# Patient Record
Sex: Female | Born: 1945 | Race: White | Hispanic: No | Marital: Single
Health system: Southern US, Community
[De-identification: ages and names within clinical notes are randomized; demographics above are authoritative.]

---

## 2004-05-29 ENCOUNTER — Emergency Department: Payer: Self-pay | Admitting: Unknown Physician Specialty

## 2004-06-23 ENCOUNTER — Emergency Department: Payer: Self-pay | Admitting: Emergency Medicine

## 2004-11-15 ENCOUNTER — Emergency Department: Payer: Self-pay | Admitting: Emergency Medicine

## 2005-02-24 ENCOUNTER — Emergency Department: Payer: Self-pay | Admitting: Emergency Medicine

## 2005-07-01 ENCOUNTER — Emergency Department: Payer: Self-pay | Admitting: Emergency Medicine

## 2005-08-17 ENCOUNTER — Other Ambulatory Visit: Payer: Self-pay

## 2005-08-17 ENCOUNTER — Emergency Department: Payer: Self-pay | Admitting: Emergency Medicine

## 2005-10-28 ENCOUNTER — Ambulatory Visit: Payer: Self-pay | Admitting: Gastroenterology

## 2006-01-01 ENCOUNTER — Other Ambulatory Visit: Payer: Self-pay

## 2006-01-01 ENCOUNTER — Emergency Department: Payer: Self-pay | Admitting: Emergency Medicine

## 2006-06-16 ENCOUNTER — Other Ambulatory Visit: Payer: Self-pay

## 2006-06-16 ENCOUNTER — Ambulatory Visit: Payer: Self-pay | Admitting: General Practice

## 2006-07-29 ENCOUNTER — Ambulatory Visit: Payer: Self-pay | Admitting: Family Medicine

## 2006-08-30 ENCOUNTER — Ambulatory Visit: Payer: Self-pay | Admitting: General Practice

## 2007-08-10 ENCOUNTER — Ambulatory Visit: Payer: Self-pay | Admitting: Family Medicine

## 2007-12-13 ENCOUNTER — Ambulatory Visit: Payer: Self-pay | Admitting: Gastroenterology

## 2008-01-26 ENCOUNTER — Ambulatory Visit: Payer: Self-pay | Admitting: Gastroenterology

## 2008-02-25 ENCOUNTER — Ambulatory Visit: Payer: Self-pay | Admitting: Oncology

## 2008-02-26 ENCOUNTER — Ambulatory Visit: Payer: Self-pay | Admitting: Oncology

## 2008-02-29 ENCOUNTER — Ambulatory Visit: Payer: Self-pay | Admitting: Oncology

## 2008-03-09 ENCOUNTER — Ambulatory Visit: Payer: Self-pay | Admitting: Oncology

## 2008-03-26 ENCOUNTER — Ambulatory Visit: Payer: Self-pay | Admitting: Oncology

## 2008-04-19 ENCOUNTER — Ambulatory Visit: Payer: Self-pay | Admitting: Oncology

## 2008-04-26 ENCOUNTER — Ambulatory Visit: Payer: Self-pay | Admitting: Oncology

## 2008-04-27 ENCOUNTER — Ambulatory Visit: Payer: Self-pay | Admitting: Oncology

## 2008-05-28 ENCOUNTER — Ambulatory Visit: Payer: Self-pay | Admitting: Oncology

## 2008-06-25 ENCOUNTER — Ambulatory Visit: Payer: Self-pay | Admitting: Oncology

## 2008-07-15 ENCOUNTER — Ambulatory Visit: Payer: Self-pay | Admitting: Oncology

## 2008-07-21 ENCOUNTER — Ambulatory Visit: Payer: Self-pay | Admitting: Oncology

## 2008-07-25 ENCOUNTER — Ambulatory Visit: Payer: Self-pay | Admitting: Oncology

## 2008-09-24 ENCOUNTER — Ambulatory Visit: Payer: Self-pay | Admitting: Oncology

## 2008-10-17 ENCOUNTER — Ambulatory Visit: Payer: Self-pay | Admitting: Oncology

## 2008-10-24 ENCOUNTER — Ambulatory Visit: Payer: Self-pay | Admitting: Oncology

## 2008-12-18 ENCOUNTER — Ambulatory Visit: Payer: Self-pay | Admitting: Family Medicine

## 2009-01-24 ENCOUNTER — Ambulatory Visit: Payer: Self-pay | Admitting: Family Medicine

## 2009-01-26 ENCOUNTER — Emergency Department: Payer: Self-pay | Admitting: Emergency Medicine

## 2009-03-26 ENCOUNTER — Ambulatory Visit: Payer: Self-pay | Admitting: Oncology

## 2009-04-16 ENCOUNTER — Ambulatory Visit: Payer: Self-pay | Admitting: Oncology

## 2009-04-26 ENCOUNTER — Ambulatory Visit: Payer: Self-pay | Admitting: Oncology

## 2009-06-24 ENCOUNTER — Ambulatory Visit: Payer: Self-pay | Admitting: Oncology

## 2009-07-16 ENCOUNTER — Ambulatory Visit: Payer: Self-pay | Admitting: Oncology

## 2009-07-24 ENCOUNTER — Ambulatory Visit: Payer: Self-pay | Admitting: General Practice

## 2009-07-25 ENCOUNTER — Ambulatory Visit: Payer: Self-pay | Admitting: Oncology

## 2009-08-08 ENCOUNTER — Ambulatory Visit: Payer: Self-pay | Admitting: General Practice

## 2009-09-24 ENCOUNTER — Ambulatory Visit: Payer: Self-pay | Admitting: Oncology

## 2009-10-15 ENCOUNTER — Ambulatory Visit: Payer: Self-pay | Admitting: Oncology

## 2009-10-24 ENCOUNTER — Ambulatory Visit: Payer: Self-pay | Admitting: Oncology

## 2009-12-25 ENCOUNTER — Ambulatory Visit: Payer: Self-pay | Admitting: Internal Medicine

## 2009-12-25 ENCOUNTER — Ambulatory Visit: Payer: Self-pay | Admitting: Oncology

## 2010-01-14 ENCOUNTER — Ambulatory Visit: Payer: Self-pay | Admitting: Oncology

## 2010-01-24 ENCOUNTER — Ambulatory Visit: Payer: Self-pay | Admitting: Oncology

## 2010-01-28 ENCOUNTER — Inpatient Hospital Stay: Payer: Self-pay | Admitting: Orthopedic Surgery

## 2010-06-01 ENCOUNTER — Ambulatory Visit: Payer: Self-pay | Admitting: Family Medicine

## 2010-07-15 ENCOUNTER — Ambulatory Visit: Payer: Self-pay | Admitting: Oncology

## 2010-07-26 ENCOUNTER — Ambulatory Visit: Payer: Self-pay | Admitting: Oncology

## 2011-06-22 ENCOUNTER — Ambulatory Visit: Payer: Self-pay | Admitting: Family Medicine

## 2011-07-08 ENCOUNTER — Ambulatory Visit: Payer: Self-pay | Admitting: Gastroenterology

## 2011-07-09 LAB — PATHOLOGY REPORT

## 2011-09-10 ENCOUNTER — Ambulatory Visit: Payer: Self-pay | Admitting: Family Medicine

## 2011-09-27 IMAGING — CR DG CHEST 1V PORT
1 series · 1 of 1 positions shown · non-contrast
Comparison: none

REASON FOR EXAM: pneumonia follow up
COMMENTS:

[view not recorded]
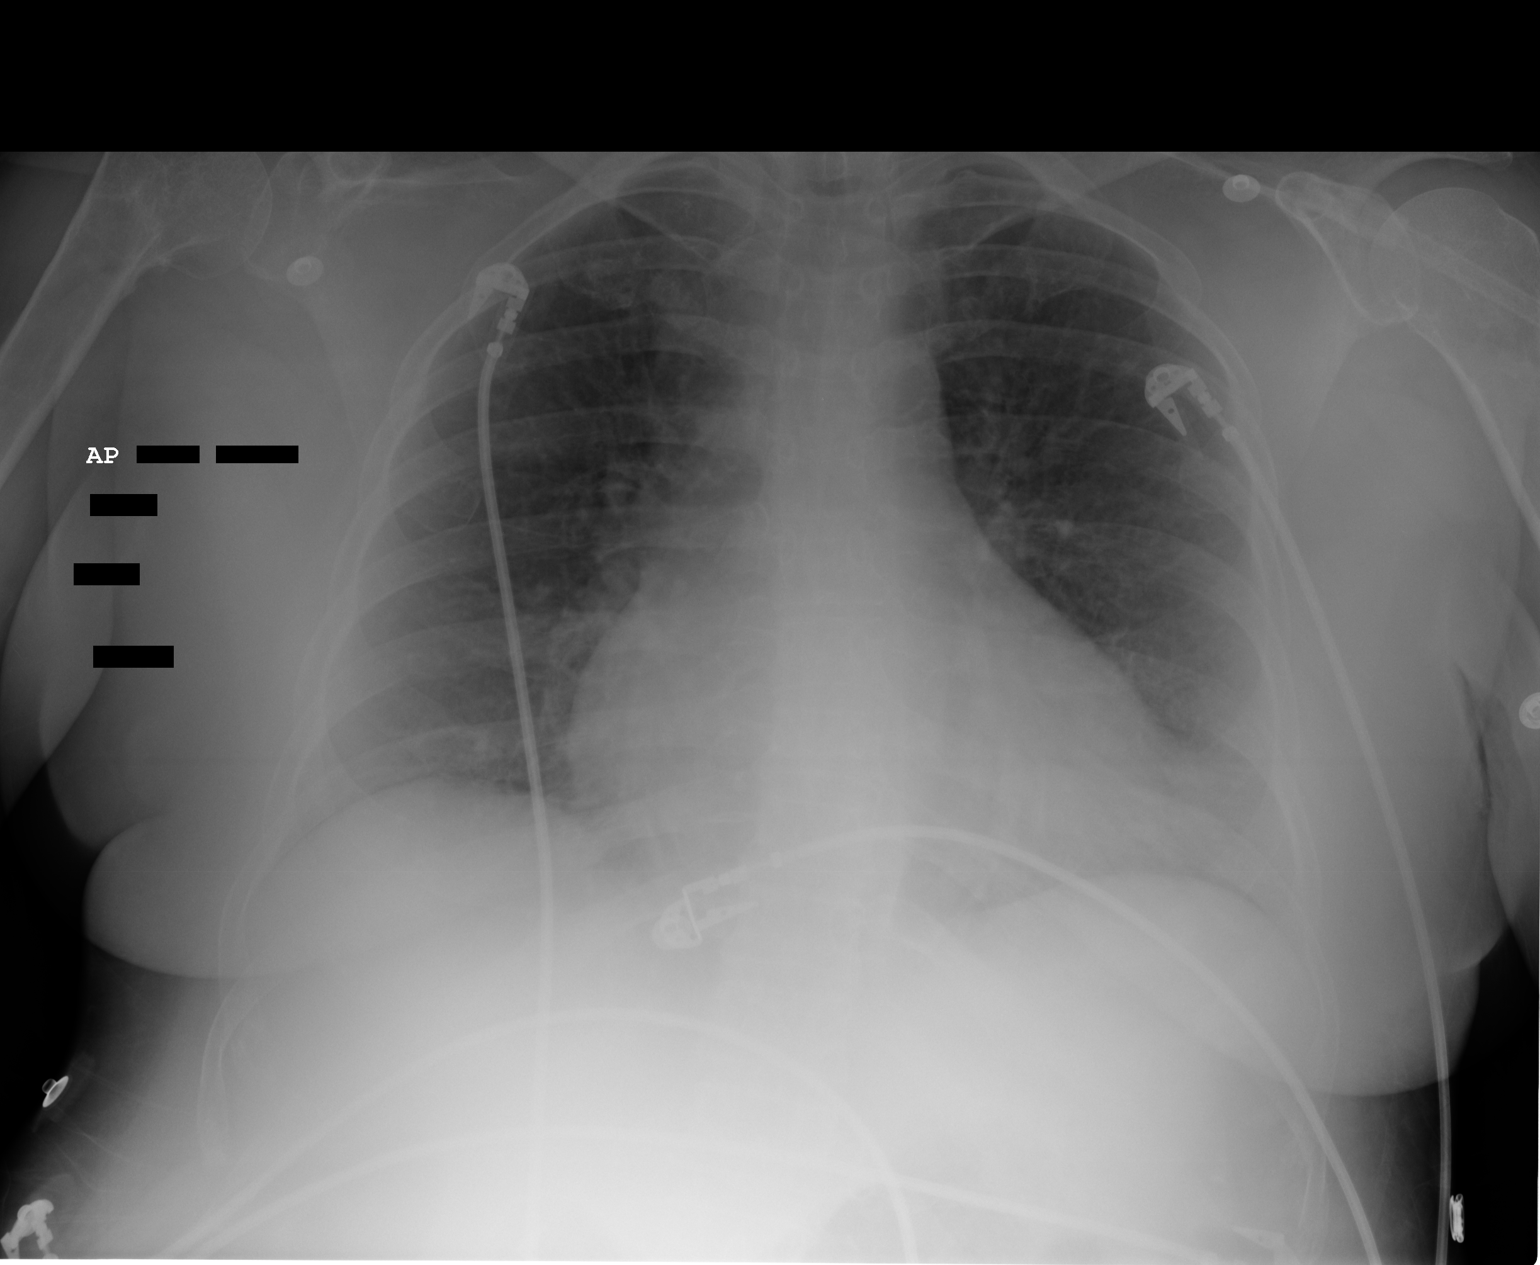

[1 of 1 positions shown; findings below may reference images not displayed]

PROCEDURE:     DXR - DXR PORTABLE CHEST SINGLE VIEW  - February 01, 2010  [DATE]

RESULT:     Comparison is made to the study 29 January, 2010.

The lungs are adequately inflated. There is no focal infiltrate. The cardiac
silhouette is mildly enlarged and the pulmonary vascularity is mildly
increased. I see no pleural effusion.
IMPRESSION: I do not see evidence of focal pneumonia. The prominence of
the cardiac silhouette and pulmonary interstitial markings suggest low-grade
CHF.

## 2011-09-27 IMAGING — CR DG ABDOMEN 1V
1 series · 1 of 1 positions shown · non-contrast
Comparison: none

REASON FOR EXAM: ileus follow up
COMMENTS:   Bedside (portable):Y

[view not recorded]
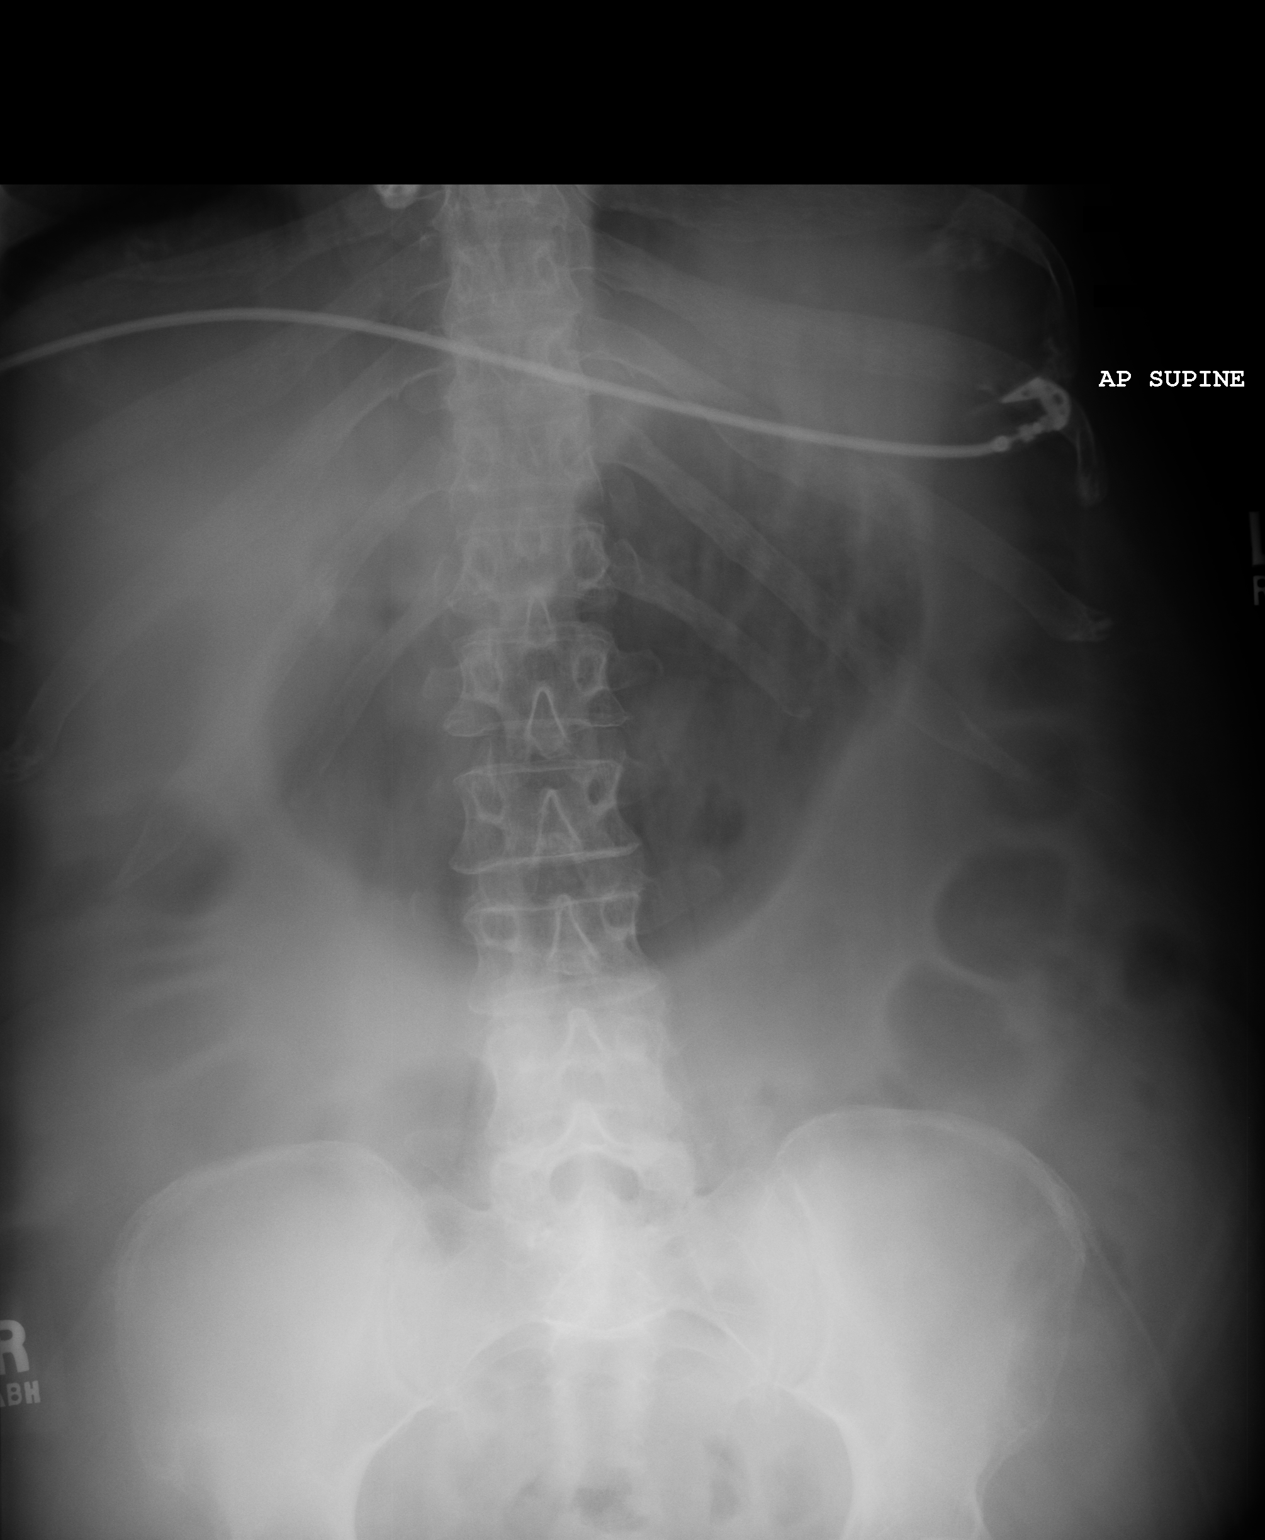

[1 of 1 positions shown; findings below may reference images not displayed]

PROCEDURE:     DXR - DXR KIDNEY URETER BLADDER  - February 01, 2010  [DATE]

RESULT:     Comparison is made to study 30 January, 2010.

The stomach is mildly distended with gas. Elsewhere I see no more than small
to moderate amount of gas within small and large bowel loops. The lower
pelvis is excluded on this study in the extreme lateral aspect of the right
flank is also excluded.
IMPRESSION: There is mild gaseous distention of the stomach which has
progressed since the previous study. Visualized portions of the abdomen and
pelvis elsewhere do not reveal definite evidence of obstruction.

## 2011-09-29 IMAGING — CR DG FEMUR 2V*R*
1 series · 4 of 4 positions shown · non-contrast
Comparison: none

REASON FOR EXAM: post op
COMMENTS:   LMP: Post-Menopausal

[Series 1: view not recorded · 0.17mm/px · 4 of 4 slices shown]
[im 1/4]
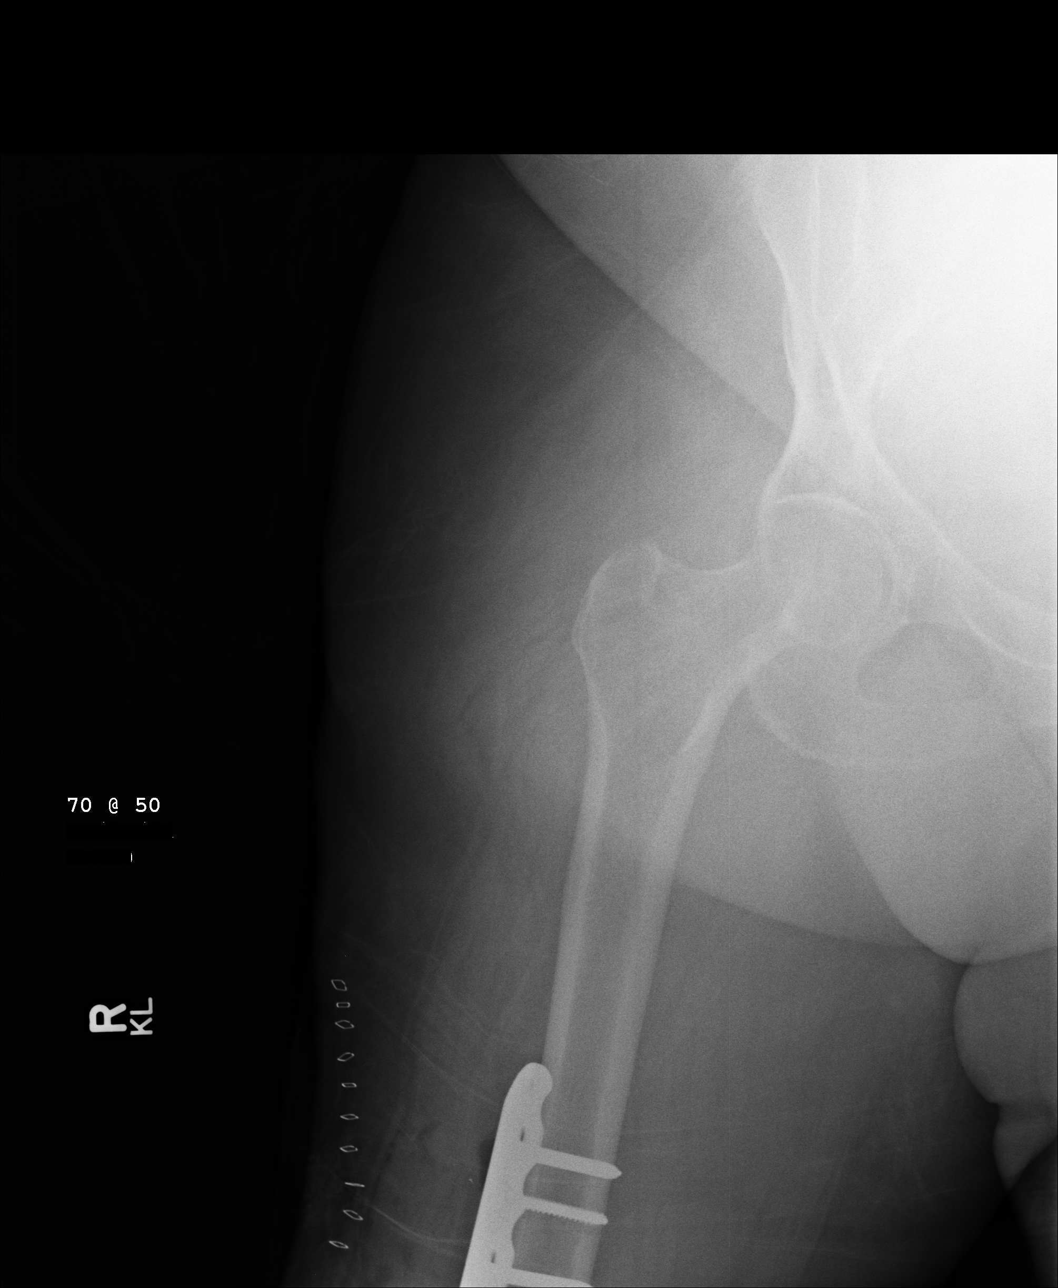
[im 2/4]
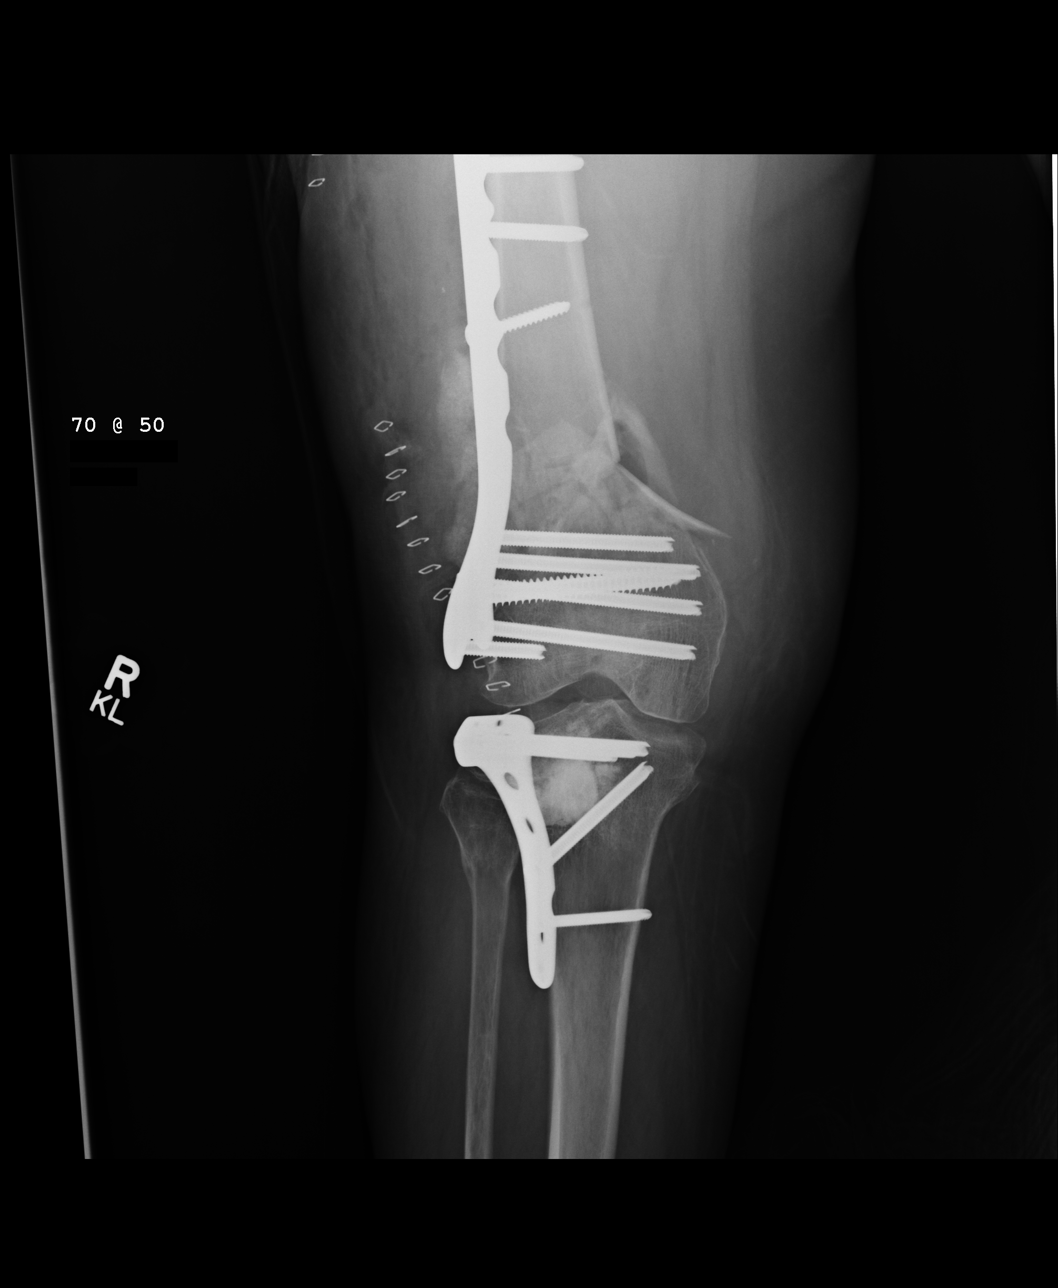
[im 3/4]
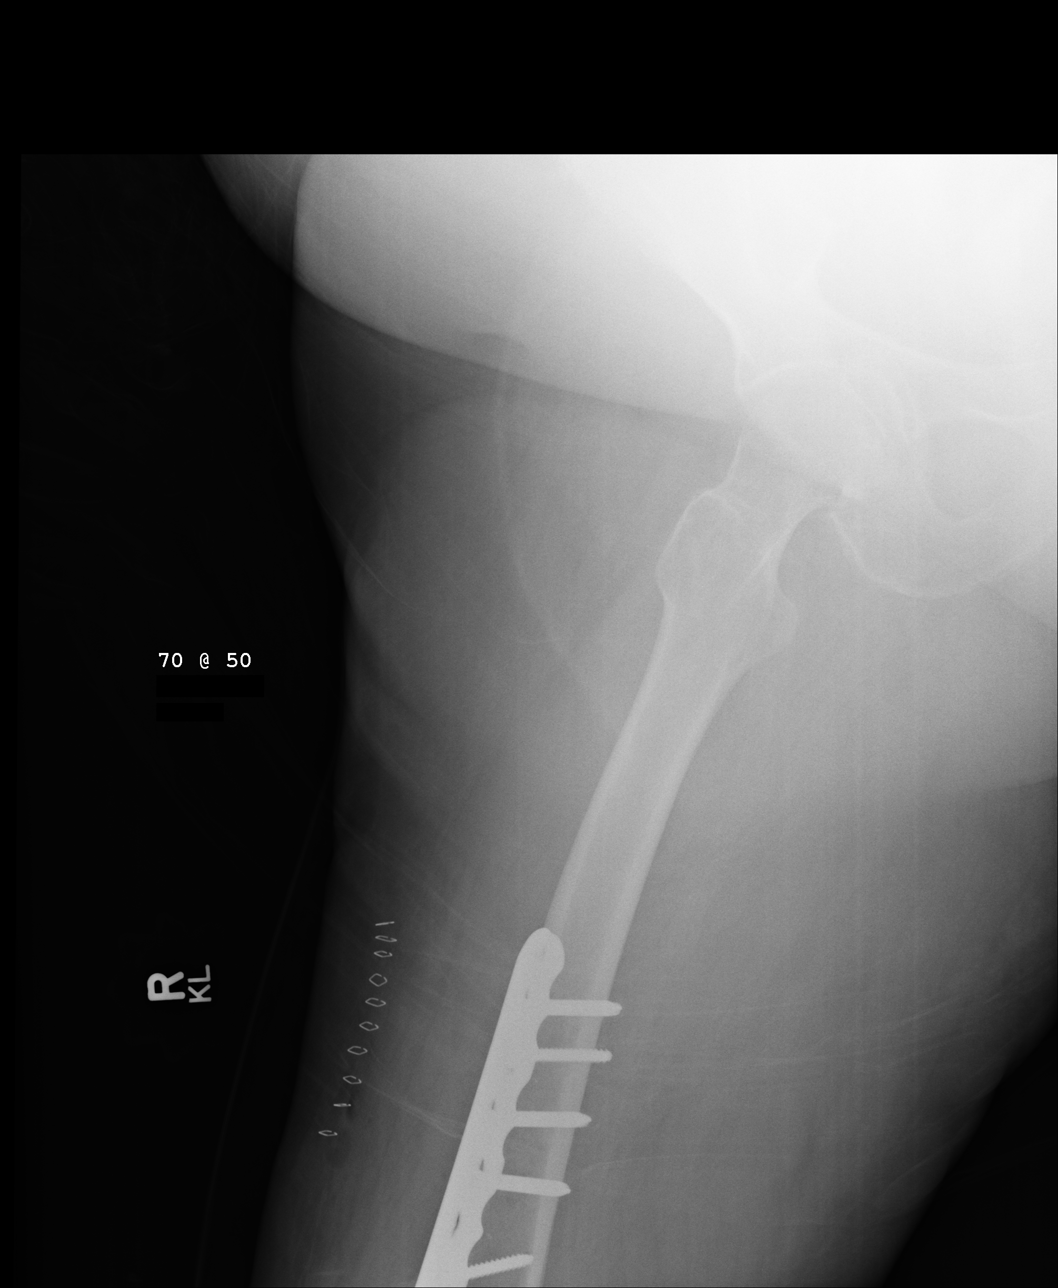
[im 4/4]
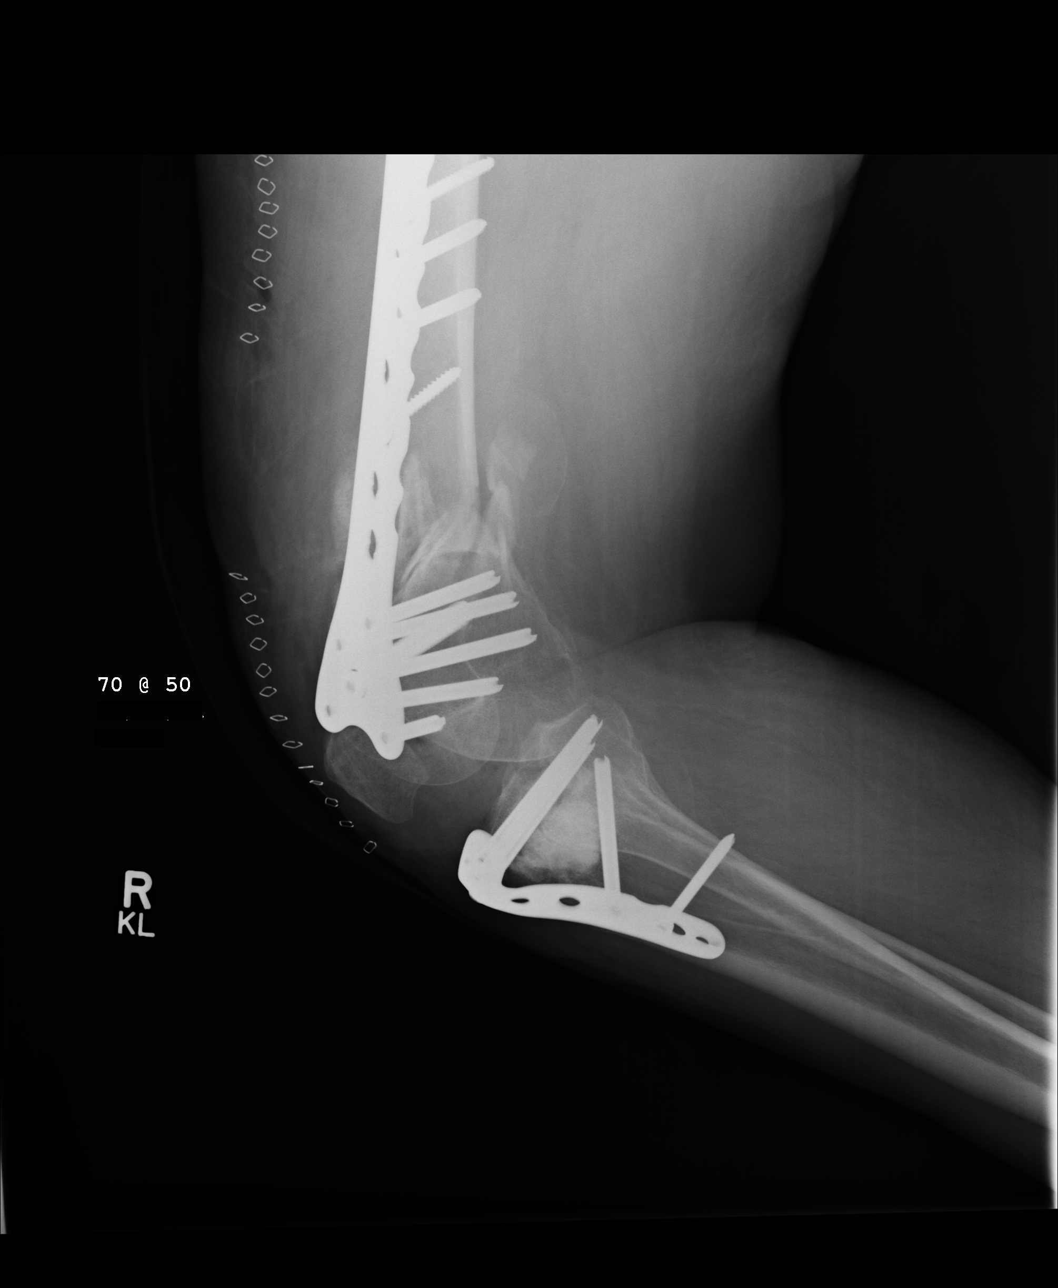

[4 of 4 positions shown; findings below may reference images not displayed]

PROCEDURE:     DXR - DXR FEMUR RIGHT  - February 03, 2010  [DATE]

RESULT:     No acute abnormality identified. The patient has a surgical
plate in the mid femur. Distal femoral fracture is reduced with slight
posterior displacement. Plate reduction of the proximal tibia is noted with
good anatomic alignment.
IMPRESSION: No acute abnormality. The patient has a surgical plate in
the mid femur. The distal femoral fracture has been reduced. There is mild
posterior displacement of the distal fracture fragment. Plate and screw also
noted in the proximal tibia. Good anatomic alignment of the tibia noted.

## 2011-09-29 IMAGING — CR DG ABDOMEN 2V
1 series · 2 of 2 positions shown · non-contrast
Comparison: none

REASON FOR EXAM: colonic ileus/pseudoobstruction
COMMENTS:

[Series 1: view not recorded · 0.17mm/px · 2 of 2 slices shown]
[im 1/2]
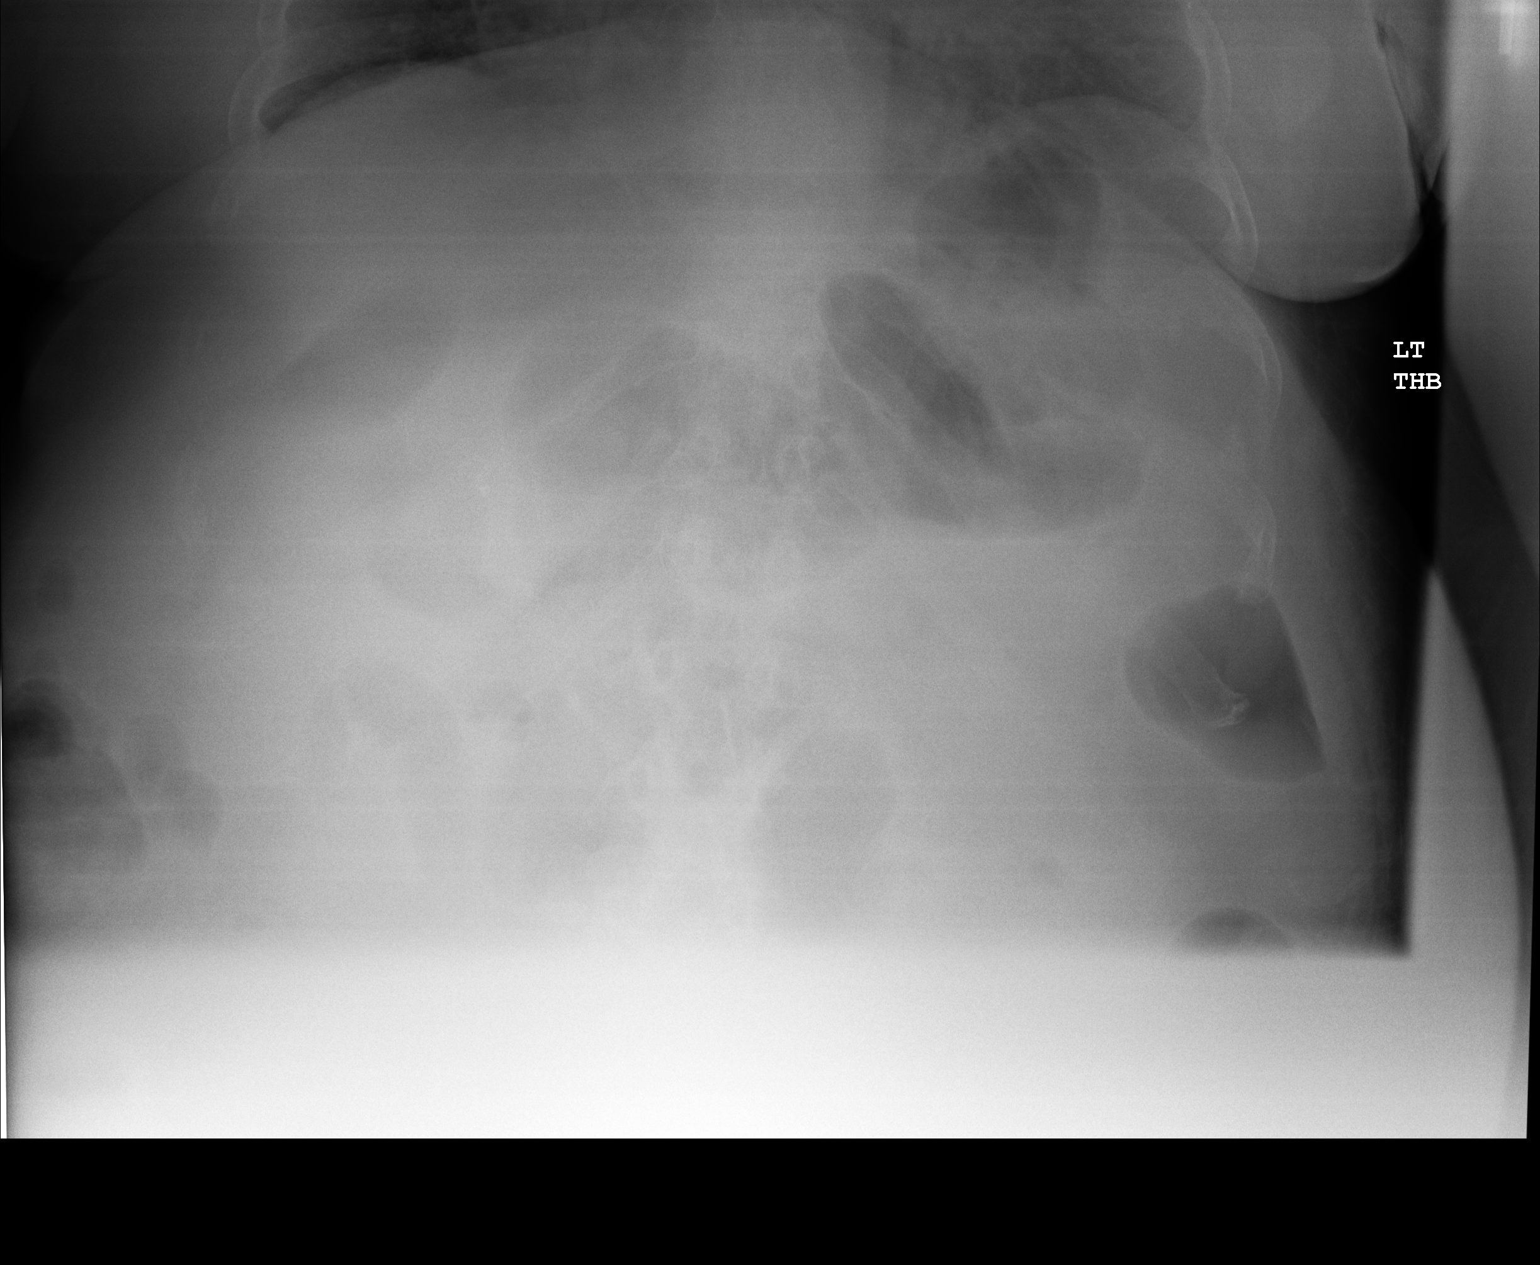
[im 2/2]
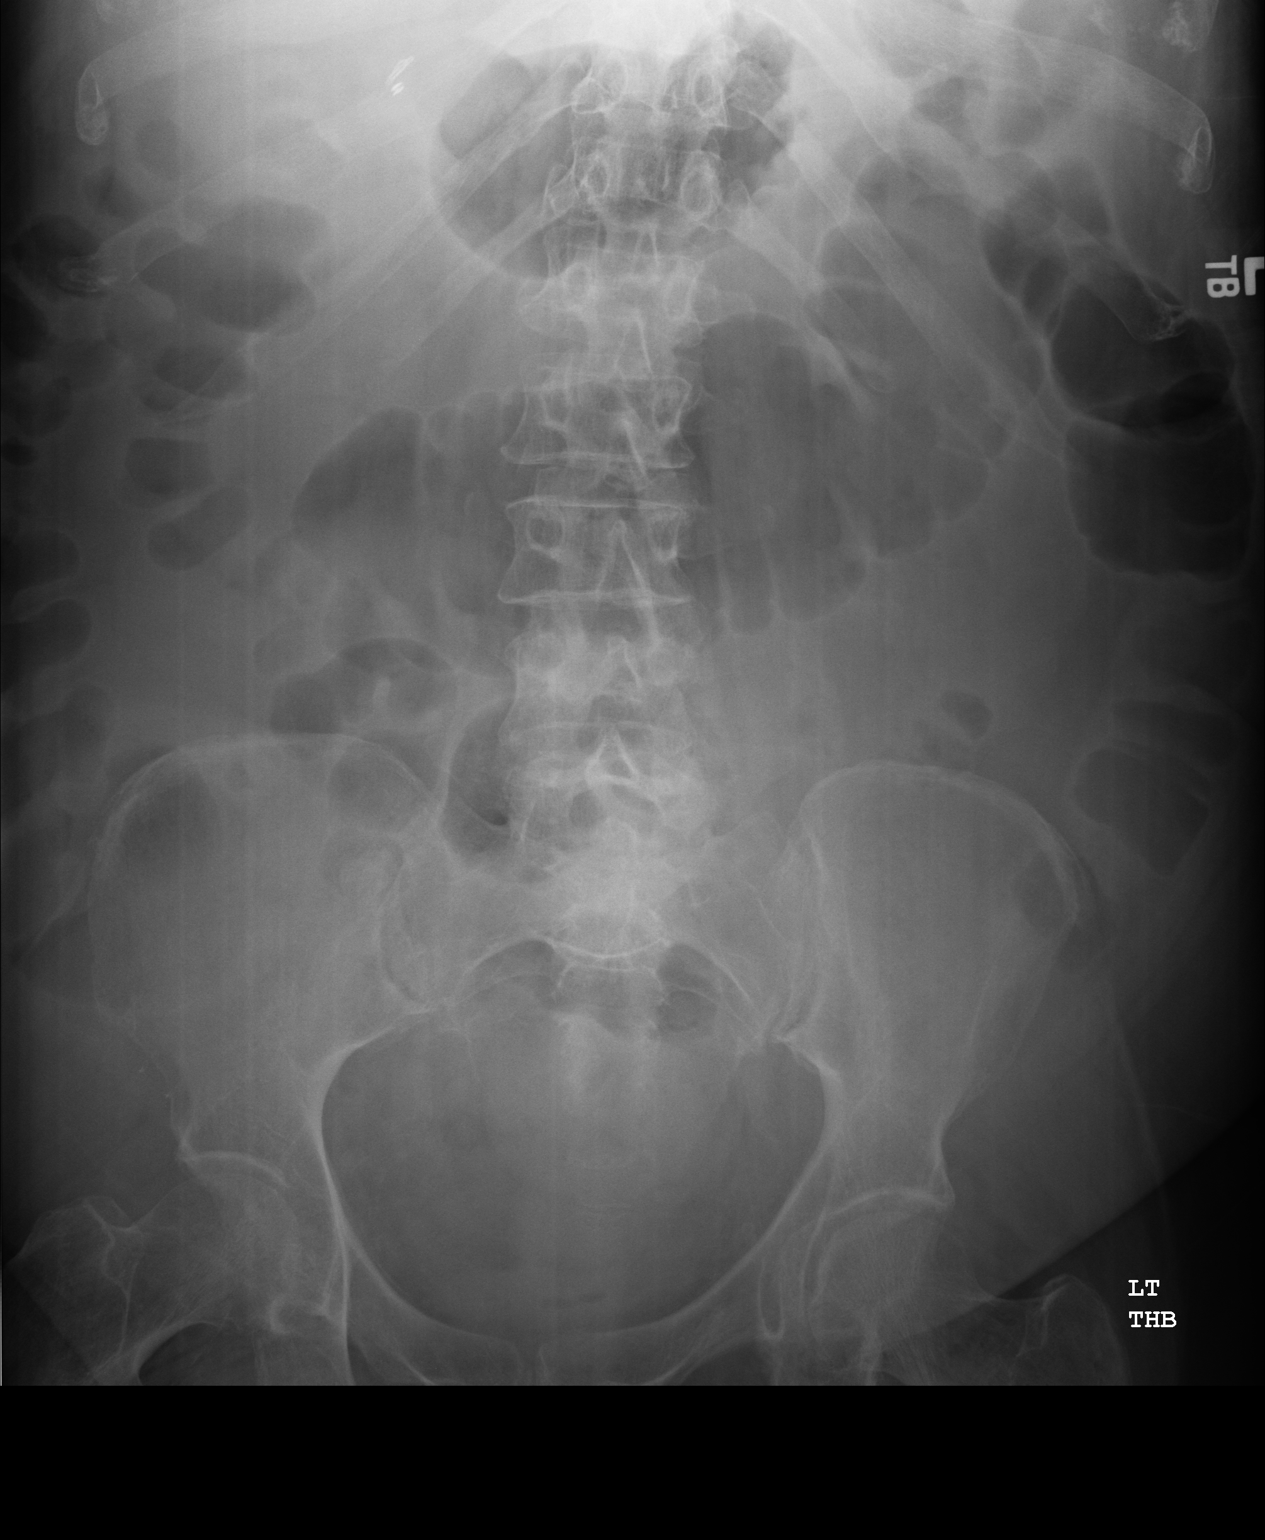

[2 of 2 positions shown; findings below may reference images not displayed]

PROCEDURE:     DXR - DXR ABDOMEN 2 V FLAT AND ERECT  - February 03, 2010  [DATE]

RESULT:     Supine and upright abdominal films are submitted. If the
patient's condition the upright film includes only the mid and upper abdomen.

The bowel gas pattern is relatively nonspecific. There is a moderate amount
of gas within the colon. There is some gas within the stomach. I see no
significant small bowel gas collections. No significant gas is seen in the
rectum.
IMPRESSION: The bowel gas pattern is nonspecific. The entities of
colonic ileus or pseudoobstruction arereasonable considerations but no more
than mild gaseous distention of the colon is seen.

## 2011-11-10 ENCOUNTER — Ambulatory Visit: Payer: Self-pay | Admitting: Family Medicine

## 2012-02-27 ENCOUNTER — Inpatient Hospital Stay: Payer: Self-pay | Admitting: Internal Medicine

## 2012-02-27 LAB — COMPREHENSIVE METABOLIC PANEL
Anion Gap: 16 (ref 7–16)
BUN: 41 mg/dL — ABNORMAL HIGH (ref 7–18)
Calcium, Total: 9.2 mg/dL (ref 8.5–10.1)
Co2: 17 mmol/L — ABNORMAL LOW (ref 21–32)
Creatinine: 2.88 mg/dL — ABNORMAL HIGH (ref 0.60–1.30)
EGFR (African American): 19 — ABNORMAL LOW
EGFR (Non-African Amer.): 17 — ABNORMAL LOW
Glucose: 180 mg/dL — ABNORMAL HIGH (ref 65–99)
Osmolality: 292 (ref 275–301)
Potassium: 4.3 mmol/L (ref 3.5–5.1)
SGOT(AST): 24 U/L (ref 15–37)
Sodium: 139 mmol/L (ref 136–145)
Total Protein: 8.2 g/dL (ref 6.4–8.2)

## 2012-02-27 LAB — TSH: Thyroid Stimulating Horm: 5.76 u[IU]/mL — ABNORMAL HIGH

## 2012-02-27 LAB — CBC WITH DIFFERENTIAL/PLATELET
Eosinophil %: 0.7 %
HCT: 39 % (ref 35.0–47.0)
MCHC: 31.5 g/dL — ABNORMAL LOW (ref 32.0–36.0)
Monocyte %: 5.6 %
Neutrophil %: 69.4 %
Platelet: 236 10*3/uL (ref 150–440)
RBC: 3.92 10*6/uL (ref 3.80–5.20)
WBC: 10.5 10*3/uL (ref 3.6–11.0)

## 2012-02-27 LAB — PRO B NATRIURETIC PEPTIDE: B-Type Natriuretic Peptide: 9093 pg/mL — ABNORMAL HIGH (ref 0–125)

## 2012-02-28 LAB — COMPREHENSIVE METABOLIC PANEL
Albumin: 3.3 g/dL — ABNORMAL LOW (ref 3.4–5.0)
Anion Gap: 9 (ref 7–16)
BUN: 42 mg/dL — ABNORMAL HIGH (ref 7–18)
Bilirubin,Total: 0.3 mg/dL (ref 0.2–1.0)
Co2: 24 mmol/L (ref 21–32)
Creatinine: 2.75 mg/dL — ABNORMAL HIGH (ref 0.60–1.30)
Glucose: 213 mg/dL — ABNORMAL HIGH (ref 65–99)
Potassium: 4.4 mmol/L (ref 3.5–5.1)
SGOT(AST): 28 U/L (ref 15–37)
SGPT (ALT): 19 U/L (ref 12–78)
Sodium: 136 mmol/L (ref 136–145)
Total Protein: 6.7 g/dL (ref 6.4–8.2)

## 2012-02-28 LAB — CK TOTAL AND CKMB (NOT AT ARMC)
CK, Total: 160 U/L (ref 21–215)
CK-MB: 6.6 ng/mL — ABNORMAL HIGH (ref 0.5–3.6)
CK-MB: 4.1 ng/mL — ABNORMAL HIGH (ref 0.5–3.6)

## 2012-02-28 LAB — PROTEIN / CREATININE RATIO, URINE
Creatinine, Urine: 38.9 mg/dL (ref 30.0–125.0)
Protein/Creat. Ratio: 6941 mg/gCREAT — ABNORMAL HIGH (ref 0–200)

## 2012-02-28 LAB — TROPONIN I: Troponin-I: 1.4 ng/mL — ABNORMAL HIGH

## 2012-02-29 LAB — CBC WITH DIFFERENTIAL/PLATELET
Basophil #: 0 10*3/uL (ref 0.0–0.1)
Eosinophil #: 0 10*3/uL (ref 0.0–0.7)
Eosinophil %: 0 %
HCT: 28.6 % — ABNORMAL LOW (ref 35.0–47.0)
HGB: 9.6 g/dL — ABNORMAL LOW (ref 12.0–16.0)
Lymphocyte %: 4 %
MCHC: 33.5 g/dL (ref 32.0–36.0)
Monocyte %: 6.3 %
Neutrophil #: 7.2 10*3/uL — ABNORMAL HIGH (ref 1.4–6.5)
Neutrophil %: 89.6 %
RBC: 3.06 10*6/uL — ABNORMAL LOW (ref 3.80–5.20)
RDW: 13.2 % (ref 11.5–14.5)
WBC: 8 10*3/uL (ref 3.6–11.0)

## 2012-02-29 LAB — RENAL FUNCTION PANEL
Albumin: 2.8 g/dL — ABNORMAL LOW (ref 3.4–5.0)
Anion Gap: 11 (ref 7–16)
BUN: 46 mg/dL — ABNORMAL HIGH (ref 7–18)
Calcium, Total: 8.7 mg/dL (ref 8.5–10.1)
Chloride: 105 mmol/L (ref 98–107)
Creatinine: 2.95 mg/dL — ABNORMAL HIGH (ref 0.60–1.30)
EGFR (African American): 19 — ABNORMAL LOW
EGFR (Non-African Amer.): 16 — ABNORMAL LOW
Glucose: 300 mg/dL — ABNORMAL HIGH (ref 65–99)
Phosphorus: 4.4 mg/dL (ref 2.5–4.9)

## 2012-02-29 LAB — AMYLASE: Amylase: 29 U/L (ref 25–115)

## 2012-02-29 LAB — APTT: Activated PTT: 121.7 secs — ABNORMAL HIGH (ref 23.6–35.9)

## 2012-02-29 LAB — HEMOGLOBIN A1C: Hemoglobin A1C: 6.4 % — ABNORMAL HIGH (ref 4.2–6.3)

## 2012-02-29 LAB — TROPONIN I: Troponin-I: 10.13 ng/mL — ABNORMAL HIGH

## 2012-03-01 ENCOUNTER — Ambulatory Visit: Payer: Self-pay | Admitting: Internal Medicine

## 2012-03-01 DIAGNOSIS — I4891 Unspecified atrial fibrillation: Secondary | ICD-10-CM

## 2012-03-01 LAB — RENAL FUNCTION PANEL
BUN: 51 mg/dL — ABNORMAL HIGH (ref 7–18)
Calcium, Total: 8.7 mg/dL (ref 8.5–10.1)
Co2: 25 mmol/L (ref 21–32)
Creatinine: 3.18 mg/dL — ABNORMAL HIGH (ref 0.60–1.30)
EGFR (Non-African Amer.): 15 — ABNORMAL LOW
Potassium: 3.9 mmol/L (ref 3.5–5.1)

## 2012-03-01 LAB — APTT: Activated PTT: 93.2 secs — ABNORMAL HIGH (ref 23.6–35.9)

## 2012-03-01 LAB — PLATELET COUNT: Platelet: 162 10*3/uL (ref 150–440)

## 2012-03-02 LAB — RENAL FUNCTION PANEL
Anion Gap: 10 (ref 7–16)
BUN: 55 mg/dL — ABNORMAL HIGH (ref 7–18)
Chloride: 103 mmol/L (ref 98–107)
EGFR (African American): 15 — ABNORMAL LOW
Glucose: 163 mg/dL — ABNORMAL HIGH (ref 65–99)
Osmolality: 298 (ref 275–301)
Phosphorus: 3.9 mg/dL (ref 2.5–4.9)
Potassium: 3.8 mmol/L (ref 3.5–5.1)
Sodium: 140 mmol/L (ref 136–145)

## 2012-03-02 LAB — CBC WITH DIFFERENTIAL/PLATELET
Eosinophil #: 0.1 10*3/uL (ref 0.0–0.7)
Eosinophil %: 1.1 %
HCT: 29.7 % — ABNORMAL LOW (ref 35.0–47.0)
Lymphocyte #: 0.9 10*3/uL — ABNORMAL LOW (ref 1.0–3.6)
MCH: 31.5 pg (ref 26.0–34.0)
MCHC: 33.7 g/dL (ref 32.0–36.0)
MCV: 93 fL (ref 80–100)
Monocyte #: 0.5 x10 3/mm (ref 0.2–0.9)
Monocyte %: 8.4 %
Neutrophil #: 4.7 10*3/uL (ref 1.4–6.5)
Neutrophil %: 76 %
RDW: 13.2 % (ref 11.5–14.5)
WBC: 6.2 10*3/uL (ref 3.6–11.0)

## 2012-03-02 LAB — APTT: Activated PTT: 37.5 secs — ABNORMAL HIGH (ref 23.6–35.9)

## 2012-03-04 LAB — CULTURE, BLOOD (SINGLE)

## 2012-03-08 ENCOUNTER — Inpatient Hospital Stay: Payer: Self-pay | Admitting: Internal Medicine

## 2012-03-08 LAB — CBC WITH DIFFERENTIAL/PLATELET
Basophil #: 0.1 10*3/uL (ref 0.0–0.1)
Basophil %: 1 %
Eosinophil #: 0.1 10*3/uL (ref 0.0–0.7)
Eosinophil %: 0.7 %
HCT: 29.6 % — ABNORMAL LOW (ref 35.0–47.0)
HGB: 10 g/dL — ABNORMAL LOW (ref 12.0–16.0)
Lymphocyte #: 1.7 10*3/uL (ref 1.0–3.6)
MCHC: 33.8 g/dL (ref 32.0–36.0)
MCV: 94 fL (ref 80–100)
Monocyte %: 5.3 %
Neutrophil %: 77.6 %
Platelet: 338 10*3/uL (ref 150–440)
RBC: 3.16 10*6/uL — ABNORMAL LOW (ref 3.80–5.20)
WBC: 11.2 10*3/uL — ABNORMAL HIGH (ref 3.6–11.0)

## 2012-03-08 LAB — COMPREHENSIVE METABOLIC PANEL
Anion Gap: 16 (ref 7–16)
Calcium, Total: 8.7 mg/dL (ref 8.5–10.1)
Chloride: 92 mmol/L — ABNORMAL LOW (ref 98–107)
Co2: 15 mmol/L — ABNORMAL LOW (ref 21–32)
Creatinine: 5.89 mg/dL — ABNORMAL HIGH (ref 0.60–1.30)
EGFR (Non-African Amer.): 7 — ABNORMAL LOW
Glucose: 158 mg/dL — ABNORMAL HIGH (ref 65–99)
Osmolality: 279 (ref 275–301)
Potassium: 4.3 mmol/L (ref 3.5–5.1)
Sodium: 123 mmol/L — ABNORMAL LOW (ref 136–145)

## 2012-03-08 LAB — PROTIME-INR
INR: 1.1
Prothrombin Time: 14.5 secs (ref 11.5–14.7)

## 2012-03-08 LAB — CK TOTAL AND CKMB (NOT AT ARMC)
CK, Total: 151 U/L (ref 21–215)
CK-MB: 7.5 ng/mL — ABNORMAL HIGH (ref 0.5–3.6)

## 2012-03-08 LAB — TROPONIN I: Troponin-I: 2 ng/mL — ABNORMAL HIGH

## 2012-03-09 LAB — BASIC METABOLIC PANEL
BUN: 97 mg/dL — ABNORMAL HIGH (ref 7–18)
Calcium, Total: 8.7 mg/dL (ref 8.5–10.1)
Chloride: 94 mmol/L — ABNORMAL LOW (ref 98–107)
Co2: 20 mmol/L — ABNORMAL LOW (ref 21–32)
Creatinine: 6.15 mg/dL — ABNORMAL HIGH (ref 0.60–1.30)
EGFR (Non-African Amer.): 7 — ABNORMAL LOW
Glucose: 143 mg/dL — ABNORMAL HIGH (ref 65–99)
Osmolality: 288 (ref 275–301)
Potassium: 4.1 mmol/L (ref 3.5–5.1)
Sodium: 127 mmol/L — ABNORMAL LOW (ref 136–145)

## 2012-03-09 LAB — TROPONIN I: Troponin-I: 3.5 ng/mL — ABNORMAL HIGH

## 2012-03-09 LAB — CBC WITH DIFFERENTIAL/PLATELET
Basophil %: 0.3 %
Eosinophil #: 0.1 10*3/uL (ref 0.0–0.7)
HGB: 9.1 g/dL — ABNORMAL LOW (ref 12.0–16.0)
MCH: 31.4 pg (ref 26.0–34.0)
MCV: 92 fL (ref 80–100)
Monocyte #: 0.7 x10 3/mm (ref 0.2–0.9)
Neutrophil %: 77.9 %
Platelet: 259 10*3/uL (ref 150–440)
RDW: 13.1 % (ref 11.5–14.5)
WBC: 9.5 10*3/uL (ref 3.6–11.0)

## 2012-03-10 LAB — BASIC METABOLIC PANEL
Anion Gap: 16 (ref 7–16)
Calcium, Total: 8.7 mg/dL (ref 8.5–10.1)
Chloride: 93 mmol/L — ABNORMAL LOW (ref 98–107)
Co2: 17 mmol/L — ABNORMAL LOW (ref 21–32)
EGFR (African American): 7 — ABNORMAL LOW
Glucose: 197 mg/dL — ABNORMAL HIGH (ref 65–99)
Sodium: 126 mmol/L — ABNORMAL LOW (ref 136–145)

## 2012-03-10 LAB — CBC WITH DIFFERENTIAL/PLATELET
Basophil #: 0 10*3/uL (ref 0.0–0.1)
Eosinophil #: 0 10*3/uL (ref 0.0–0.7)
Eosinophil %: 0 %
HCT: 24.5 % — ABNORMAL LOW (ref 35.0–47.0)
Lymphocyte #: 0.5 10*3/uL — ABNORMAL LOW (ref 1.0–3.6)
MCH: 29.6 pg (ref 26.0–34.0)
MCHC: 31.8 g/dL — ABNORMAL LOW (ref 32.0–36.0)
MCV: 93 fL (ref 80–100)
Monocyte %: 12 %
Neutrophil #: 9.4 10*3/uL — ABNORMAL HIGH (ref 1.4–6.5)
Neutrophil %: 83.1 %
RDW: 13.3 % (ref 11.5–14.5)

## 2012-03-10 LAB — URINALYSIS, COMPLETE
Ketone: NEGATIVE
Nitrite: NEGATIVE
Ph: 5 (ref 4.5–8.0)
Protein: 30
Specific Gravity: 1.012 (ref 1.003–1.030)
Squamous Epithelial: <1
WBC UR: 1 /HPF (ref 0–5)

## 2012-03-26 ENCOUNTER — Ambulatory Visit: Payer: Self-pay | Admitting: Internal Medicine

## 2012-03-26 DEATH — deceased

## 2013-11-01 IMAGING — CR DG CHEST 1V PORT
1 series · 1 of 1 positions shown · non-contrast
Comparison: none

REASON FOR EXAM: Shortness of Breath
COMMENTS:

PROCEDURE:     DXR - DXR PORTABLE CHEST SINGLE VIEW  - March 08, 2012  [DATE]
RESULT:     Comparison: 02/27/2012

[ap]
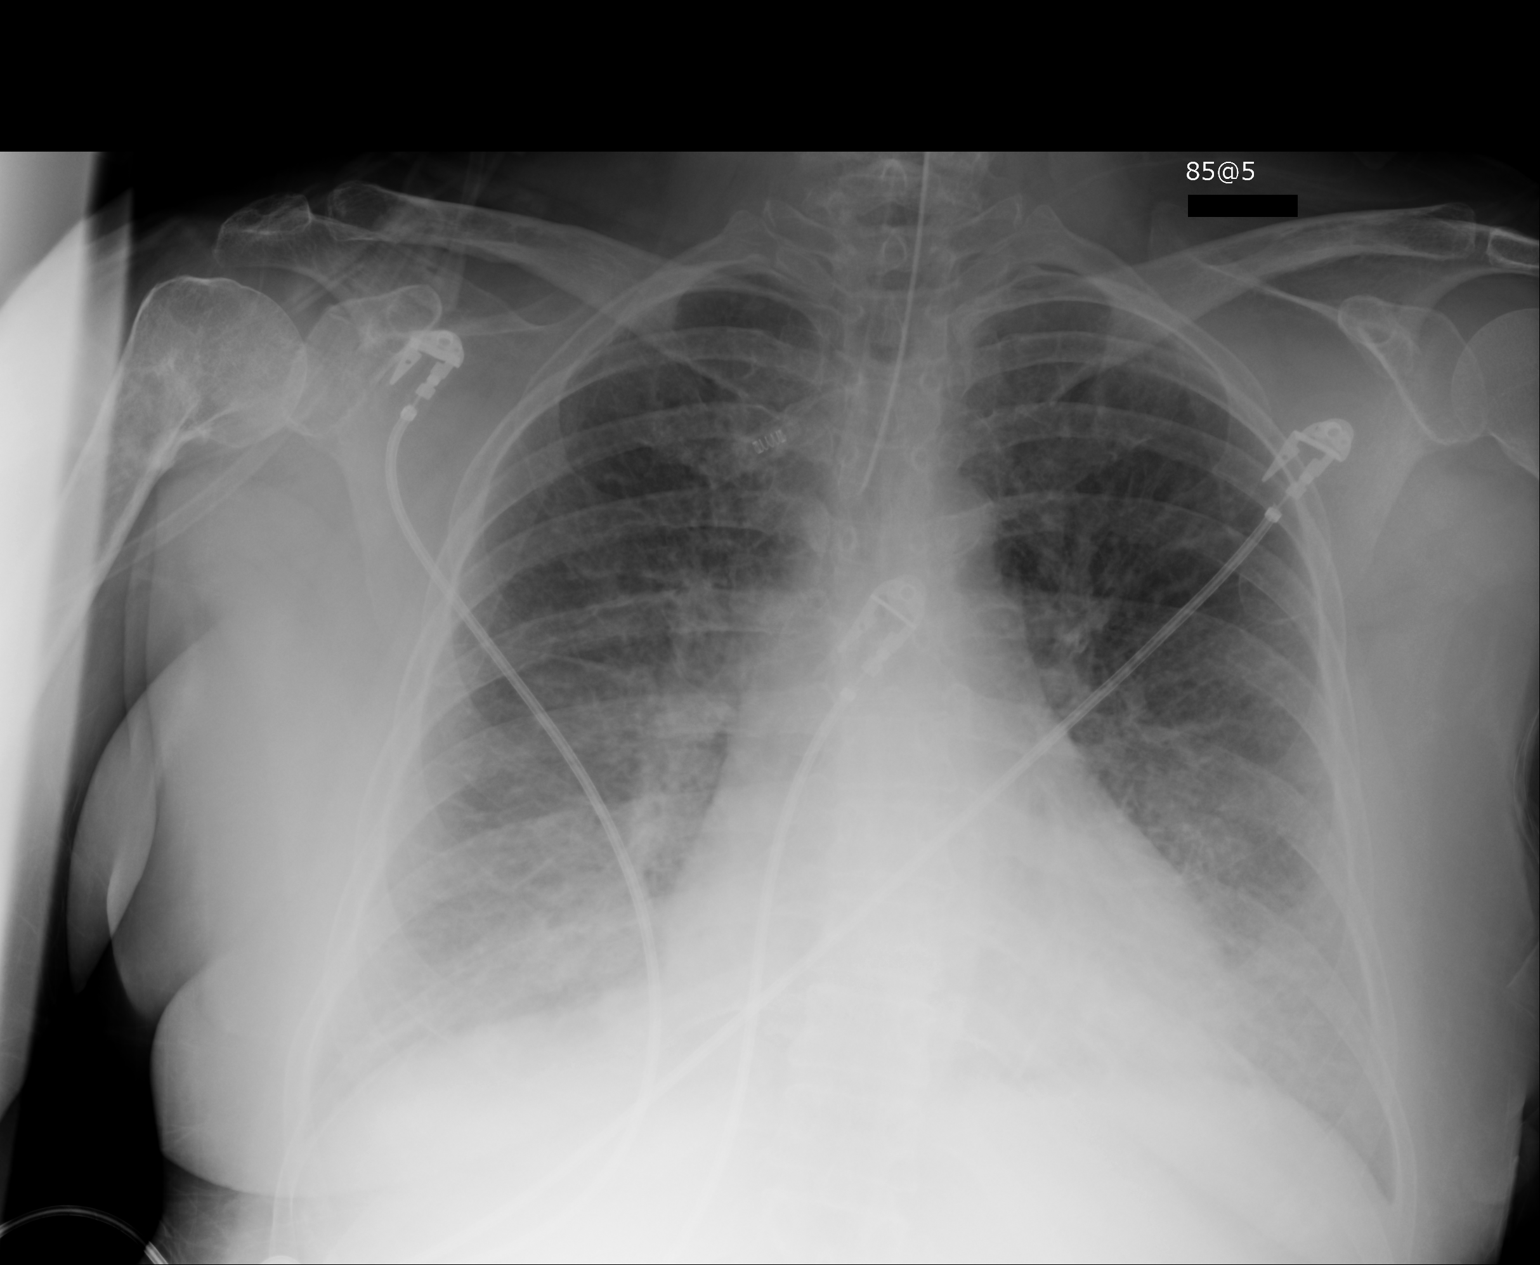

[1 of 1 positions shown; findings below may reference images not displayed]

FINDINGS: Endotracheal tube tip terminates in the mid intrathoracic trachea. Heart and
mediastinum are stable. There are bilateral interstitial opacities which are
essentially new from prior. There is an old fracture of the right humeral
head and neck junction.
IMPRESSION: 1. Endotracheal tube tip in the mid intrathoracic trachea.
2. Interstitial pulmonary edema.

[REDACTED]

## 2014-08-13 NOTE — Consult Note (Signed)
Brief Consult Note: Diagnosis: sob/chf.   Patient was seen by consultant.   Consult note dictated.   Comments: Appreciate consult for 69 y/o caucasian woman with history of MR, CKD, DM, HL, obesity, who was admitted for sob/chf & found to have NSTEMI, for evaluation of abdominal pain and nausea, onset today. Currently patient states she has absolutely no abdominal pain, but it was epigastric in nature. States also that her nausea is persistent. Did vomit 3 times earlier today, green material. Denies all further GI complaints. Last bm 02/26/12. Had noncontrasted CT of abdomen/pelvis that showed some pulmonary edema and cardiomegaly. There were no GI findings, although a note was made about a questionable area of inflammation near the umbilicus, likely related to a subq injection: she has been receiving heparin subcutaneous, and is now on a heparin gtt per normogram. Did note jump in troponin today, echo findings, and cardiology note.  Additionally, patient had colonoscopy 3/13 with one adenoma, one hyperplastic polyp. Also underwent EGD 2007 that was noted to be normal. Impression and plan: Abdominal pain, nausea. Abdominal pain resolved. Nausea- somewhat persistent. Vomiting- resolved. These may be symptoms of her NSTEMI. Will assess stool for H pylori, change PPI to bid IV Pantoprazole. Will schedule some anti-emetic for better control and start Miralax daily prn since it has been 3d since bm. She is not a candidate for endoscopic procedures at this time. Will follow..  Electronic Signatures: Vevelyn PatLondon, Jayke Caul H (NP)  (Signed [phone]-Nov-13 16:05)  Authored: Brief Consult Note   Last Updated: 05-Nov-13 16:05 by Keturah BarreLondon, Laiken Sandy H (NP)

## 2014-08-13 NOTE — Discharge Summary (Signed)
PATIENT NAME:  Sylvia IsaacsCOOK, Magon E MR#:  [id number] DATE OF BIRTH:  1945/08/18  DATE OF ADMISSION:  03/08/2012 DATE OF DISCHARGE:  12-21-2011  ADMITTING DIAGNOSIS: Acute respiratory failure.   DISCHARGE DIAGNOSES:  1. Acute respiratory failure due to acute on chronic diastolic congestive heart failure.  2. Non-ST myocardial infarction.  3. Diabetes, type 2.  4. Acute on chronic renal failure.  5. Hyperlipidemia.  6. Anemia.  7. Hypertension. 8. Intellectual disability.  9. Depression. 10. Gastroesophageal reflux disease.   CONSULTANT: Palliative care.   LABORATORY, DIAGNOSTIC AND RADIOLOGICAL DATA: Admitting glucose 158, BUN 92, sodium 123, potassium 4.3, CO2 92, calcium 8.7. LFTs showed albumin of 3.0. Troponin was 2 then subsequently went to 3.50. WBC count 11, hemoglobin 10 then hemoglobin did drop down to 7.8 today.    HOSPITAL COURSE: Please refer to history and physical done by the admitting physician. Patient is a 69 year old white female with multiple medical problems, hypertension, diabetes with recent hospitalization from 11/03 to 11/08 was admitted at that time with acute myocardial infarction, acute respiratory failure. She was discharged and was brought back because patient started having vomiting with chest pain. When EMS arrived her sats were in the 70%. Patient got intubated in the ED because her DO NOT RESUSCITATE status was not available. However, patient was DO NOT RESUSCITATE. Once she was intubated in the ED the health care power of attorney was contacted and she was extubated, placed on BiPAP for her respiratory failure. Patient was kept on BiPAP until early this morning. She was thought to have acute on chronic diastolic failure, treated with IV Lasix. She also had acute renal failure which was felt to be due to volume overload and her respiratory status did improve with the Lasix, however, her renal function continued to deteriorate. She also had an MI which she was not felt  to be a good candidate for intervention based on her multiple medical problems during her previous hospitalizations. Discussions were held with her health care power of attorney and it was decided to make her comfort care and be transferred to hospice.   DISPOSITION: Discharge to hospice facility.   DIET: As tolerated.  CODE: DO NOT RESUSCITATE.   ACTIVITY: As tolerated.   DISCHARGE INSTRUCTIONS: Medications may be crushed. Oxygen 2 liters. Leave Foley in for urinary incontinence, prevent skin breakdown.   MEDICATIONS:  1. Roxanol 0.25 to 5 mL p.o. sublingual q.1 to 2 hours p.r.n. pain. 2. Lorazepam 0.5 to 1 mg p.o. sublingually q.2 to q.4 p.r.n. anxiety.  3. Ranitidine 150 p.o. b.i.d.  4. Reglan p.r.n.   TIME SPENT: 35 minutes spent on the discharge.   ____________________________ Lacie ScottsShreyang H. Allena KatzPatel, MD shp:cms D: 12-21-2011 14:40:03 ET T: 03/11/2012 16:13:22 ET JOB#: [id number]  cc: Berenise Hunton H. Allena KatzPatel, MD, <Dictator>  Charise CarwinSHREYANG H Awilda Covin MD ELECTRONICALLY SIGNED 03/13/2012 10:26

## 2014-08-13 NOTE — H&P (Signed)
PATIENT NAME:  Sylvia Strong, PETTERSON MR#:  [id number] DATE OF BIRTH:  04-07-1946  DATE OF ADMISSION:  03/08/2012  PRIMARY CARE PHYSICIAN:  Dr. Jerl Mina.  ER PHYSICIAN: Dr. Cyril Loosen.   CHIEF COMPLAINT: Respiratory failure.   HISTORY OF PRESENT ILLNESS:  The patient is a 69 year old female with multiple medical problems, hypertension, diabetes, recent hospitalization from 11/03 to 11/08 with acute myocardial infarction and acute respiratory failure, discharged on 11/08 to [facility]. The patient was sent in here today because the patient had an episode of vomiting associated with chest pain and when EMS arrived the patient's oxygen saturations were 70% on room air. In the ER the patient persistently was around 80-82% and got intubated immediately by ER physician. The patient is DO NOT RESUSCITATE but the patient did not get DO NOT RESUSCITATE papers by EMS and also from group home so they had to intubate her for acute respiratory failure. The patient was discharged on 11/08. According to the caregiver she was doing fine, but yesterday and today her p.o. intake was poor and she had an episode of vomiting and then chest pain. Because of her recent history of myocardial infarction, the caregivers decided to call EMS. The patient was found to have congestive heart failure with BNP of 20,336, pH 7.13, pC02 49. The patient is intubated in the Emergency Room. The patient right now is on Midazolam drip. Blood pressure is around 104/94 and heart rate 43.  PAST MEDICAL HISTORY:  1. Hypertension. 2. Diabetes. 3. Congestive heart failure before. 4. Acute myocardial infarction before. 5. Acute on chronic kidney disease stage IV. 6. Intellectual disability.  7. Depression. 8. Gastroesophageal reflux disease.   The patient was seen by Dr. Gwen Pounds, Dr. Thedore Mins and Dr. Harvie Junior during the last admission. She was made DO NOT RESUSCITATE. The patient had non-ST-elevation myocardial infarction. Troponin up to 2.30. The  patient also was on heparin drip and then changed to aspirin and Plavix and he is going to follow her up as an outpatient for stress test. The patient had diabetic gastroparesis resolved with Reglan at that time.   ALLERGIES: The patient is allergic to aspirin, Cipro, HCTZ, Levaquin and Septra.   SOCIAL HISTORY: She has no family and power of attorney is her two friends, Santina Evans and [place]. No history of smoking or drinking.   FAMILY HISTORY: Unknown.  MEDICATIONS FROM THE LAST DISCHARGE:  1. Multivitamin 1 tablet p.o. b.i.d.  2. Tums 500 milligrams p.o. b.i.d.  3. Amlodipine 10 mg daily.  4. Nexium 40 mg daily. 5. Clonidine 0.1 mg p.o. b.i.d.  6. Paxil 30 mg daily.  7. Simvastatin 20 mg daily. 8. Neurontin 300 mg p.o. daily.  9. Nitro-Bid two inches daily one patch. 10. Humalog 6 units t.i.d.  11. Lantus 26 units b.i.d.  12. Humalog with sliding scale.  13. Hydralazine 50 mg p.o. 4 times daily.  14. Metoprolol 25 mg p.o. b.i.d.  15. Lisinopril decreased to 20 mg daily during the last discharge.  16. Plavix 75 mg p.o. daily.  17. Azithromycin was given last time for three days, so discontinue Azithromycin.   18. Reglan 5 mg p.o. 4 times daily before meals and bedtime for diabetic gastroparesis.   REVIEW OF SYSTEMS: Not obtainable because she is intubated and sedated.   PHYSICAL EXAMINATION:  VITAL SIGNS: Temperature 97.9, pulse 44, respirations right now she is on full vent support 500, 60, 90/5 and sats 100%. The patient's blood pressure initially was low and  HR  on  the low side. When she was intubated, blood pressure dropped and nitro paste was discontinued and blood pressure improved nicely. She did not get any fluids.   HEENT: Head atraumatic, normocephalic. Pupils equally reacting to light.   ENT: No tympanic membrane congestion. No turbinate hypertrophy. Intubated and sedated.,   NECK: No thyroid enlargement. No JVD.   RESPIRATORY: Bilateral coarse breath  sounds.   CARDIOVASCULAR: S1 and S2 regular. No murmurs, bradycardic.   ABDOMEN: Obese. Bowel sounds are present and no organomegaly. No hernias.   EXTREMITIES: 1+ edema bilaterally.   SKIN: No skin rashes.   NEUROLOGIC: Unable to do because she is intubated and sedated.   PSYCHOLOGIC: She has significant mental retardation.   LABORATORY, DIAGNOSTIC AND RADIOLOGICAL DATA: ABG; 7.13 pH, pC02 49, pO2 315, FI02 100 and she is on ventilator at the time. Lactic acid 1.3. INR 1.1. BNP 20,336. CK total 151, CPK-MB 7.5. WBC 11.2, hemoglobin 10, hematocrit 29.6, platelets 338. Electrolytes: Sodium 123, potassium 4.3, chloride 92, bicarbonate 15, BUN 92, creatinine 5.8 and glucose 158. Troponin 2. BUN and creatinine on 11/07: BUN 55, creatinine 3.49. At that time sodium was 140 on 11/07. Chest x-ray is consistent with congestive heart failure. EKG shows ST depressions in V4, V5, V6. These are a little bit more pronounced from 11/06 EKG. Echocardiogram during the last admission on 11/04 showed EF around 50 percent.   ASSESSMENT AND PLAN:  1. The patient is a 69 year old female with acute respiratory failure secondary to congestive heart failure exacerbation with evidence of hyponatremia and worsening renal failure. The patient is  intubated and sedated. The patient has a DO NOT RESUSCITATE which was not obtained by EMS. Anyway, I am going to talk to the power of attorney who is on their way right now. We will continue the full vent support and use dopamine because of bradycardia and also hypotension. Hold the Lasix at this time. Consult nephrology because of acute on chronic renal failure with hyponatremia and congestive heart failure.  2. Acute non-ST-elevation myocardial infarction. The patient's troponin was 10 during the last admission. The patient was seen by cardiology as well, Dr. Gwen PoundsKowalski. At this time, unable to give beta blockers because of the hypotension and bradycardia and she is allergic to  aspirin. Blood pressure is low, so unable to give Lasix also. So watch her in the Intensive Care Unit. Obtain cardiology consult and nephrology consult and continue nebulizers, full vent support. Check ABG and x-ray in the morning. Elevated troponins are secondary to non-ST-elevation myocardial infarction. As I mentioned, Nitro-Bid paste is discontinued due to hypotension.  3. The patient has type 2 diabetes. Hold the Lantus. Continue sliding scale with coverage.  4. CODE STATUS: DO NOT RESUSCITATE.   PROGNOSIS: Poor due to multiple medical issues with multiorgan failure.   TIME SPENT: About 55 minutes.   ____________________________ Katha HammingSnehalatha Keano Guggenheim, MD sk:ap D: 03/08/2012 19:08:09 ET T: 03/09/2012 07:37:41 ET JOB#: [id number]  cc: Katha HammingSnehalatha Kordel Leavy, MD, <Dictator> Rhona LeavensJames F. Burnett ShengHedrick, MD Katha HammingSNEHALATHA Neftaly Swiss MD ELECTRONICALLY SIGNED 04/11/2012 14:52

## 2014-08-13 NOTE — Discharge Summary (Signed)
PATIENT NAME:  Demetrios IsaacsCOOK, Kenley E MR#:  [id number] DATE OF BIRTH:  [birth year]  DATE OF ADMISSION:  02/27/2012 DATE OF DISCHARGE:  03/03/2012  DISCHARGE DIAGNOSES:  1. Acute hypoxic respiratory failure due to CHF and pneumonia.  2. Acute subendocardial MI.  3. Intractable nausea and vomiting, likely diabetic gastroparesis.  4. Diabetes mellitus, type II. 5. Hypertension.  6. Acute on chronic kidney disease stage IV.  7. Intellectual disability.  8. Depression.  9. GERD.   DISCHARGE MEDICATIONS:  1. Multivitamin 1 tablet p.o. b.i.d. 2. TUMS 500 mg p.o. b.i.d.  3. Amlodipine 10 mg p.o. daily.  4. Nexium 40 mg p.o. daily.  5. Clonidine 0.1 mg p.o. b.i.d.  6. Paxil 30 mg p.o. daily.  7. Simvastatin 20 mg p.o. daily.  8. Neurontin 300 mg p.o. daily.  9. Nitro-Bid 2 inches transdermal daily.  10. Humalog 6 units t.i.d.  11. Lantus 26 units at bedtime.  12. Humalog with sliding scale with coverage.  13. Hydralazine 50 mg p.o. q.i.d.  14. Metoprolol 25 mg p.o. b.i.d.  15. Lisinopril decreased to 20 mg daily.  16. Plavix 75 mg p.o. daily.  17. Azithromycin 500 mg p.o. daily for three days.  18. Reglan 5 mg p.o. 4 times daily before meals and at bedtime for diabetic gastroparesis.  DO NOT TAKE: Benicar.   DIET: Low sodium, low fat, ADA diet.   CONSULTATIONS:  1. Cardiology consult with Dr. Gwen PoundsKowalski  2. Nephrology consult with Dr. Thedore MinsSingh  3. Palliative Care consult with Dr. Harvie JuniorPhifer  4. Physical therapy consult   FOLLOW-UP:  1. Appointment was made Dr. Gwen PoundsKowalski on November 20th.  2. The patient also has appointment with Dr. Marva PandaSkulskie on November 18th.   3. The patient has appointment with her primary doctor on November 22nd.  4. Appointment with Dr. Thedore MinsSingh on November 21st.   HOSPITAL COURSE:  1. The patient is a 69 year old female with hypertension, diabetes, history of mental retardation, and depression who was brought in by her healthcare power-of-attorney because of shortness of  breath for two days. The patient initially was put on BiPAP and heart rate also was very low around 30's when she came but after starting the BiPAP the patient's heart rate improved to 50's and sats were also 100%. The patient's ABG on admission pH 7.19, pCO2 42, pO2 246, sats 99% on BiPAP. The patient's EKG showed nonspecific ST-T changes consistent with CHF. The patient was admitted for acute respiratory failure due to CHF, started on BiPAP and then weaned off, started on IV Lasix, admitted to telemetry. The patient was monitored for CHF exacerbation  and also daily creatinine and daily weights. The patient was started on Nitro-Bid paste along with hydralazine, echocardiogram and cardiac markers. The patient's symptoms of CHF improved. The patient continued to have nausea and vomiting and chest pain.  2. Non-ST-elevation MI, troponin  peak to 10. The patient developed chest pain. Dr. Gwen PoundsKowalski has seen the patient and started her on heparin drip along with beta-blockers. The patient does have allergy to aspirin so aspirin was not given. Troponin  rose from . 1.4 to 10.She continued on telemetry with heparin drip and nitro. The patient was seen by Dr. Gwen PoundsKowalski who recommended no cardiac intervention because of her kidney disease, hypertension, and other medical problems with mental retardation. Anyway, she was continued on heparin dripe patient was changed to Plavix.  3. Severe intractable nausea and vomiting. The patient has diabetes. Initially the nausea and vomiting did not get better  with Zofran and Phenergan but Reglan was started for possible diabetic gastroparesis and after starting Reglan she had improvement in nausea and vomiting. GI has seen the patient. The patient had a CAT scan of the abdomen done which showed only interstitial infiltrate with pulmonary edema and trace pleural effusion. No evidence of inflammatory abnormalities. The patient had a focal area of increased in the subcutaneous fat but CAT  scan of the abdomen is essentially normal. The patient was seen by Dr. Harvie Junior also and the patient was made DO NOT RESUSCITATE. She was started on Reglan. Her nausea improved and she is scheduled for outpatient gastric emptying study.   4. Diabetes mellitus, type II. She was continued on Lantus and sliding scale coverage. Her diet was advanced because her nausea and vomiting resolved and p.o. intake also improved. During this hospital stay because of vomiting and diarrhea lasix was held.  Benicar also is stopped. Spoke with Dr. Thedore Mins as well and the patient's BUN and creatinine on admission showed BUN 41 and creatinine 2.88. The patient's BUN and creatinine went up to BUN 51 and creatinine 3.18. The patient was seen by Nephrology as well. The patient was making good urine output. Discharge medications were also reviewed with him.   5. Hypoxia. The patient did have some coughing. Chest x-ray was concerning for pneumonia and was started on Zithromax. The patient improved nicely. Her oxygen saturations also improved. She was needing 3 liters of oxygen when she came but then after starting Zithromax the patient's oxygen improved on room air to 90% at the time of discharge.  6. High blood pressure. The patient's blood pressure was consistently elevated. Hydralazine was increased to 50 mg and metoprolol was decreased to 25 b.i.d. because heart rate has been low when she came which picked up later but it is around 60's on metoprolol.   The patient will be going to group home. She was seen by physical therapist who recommended no physical therapy needed. The patient was discharged back to [facility] facility. The patient was advised to have home health physical therapy. I spoke with case manager who spoke in turn to [facility] and they are going to arrange for home health. Power-of-attorney was informed about that.   TIME SPENT ON DISCHARGE PREPARATION: More than 30 minutes.   CODE STATUS: DO NOT  RESUSCITATE.   ____________________________ Katha Hamming, MD sk:drc D: 03/03/2012 22:57:50 ET T: 03/05/2012 15:25:36 ET JOB#: [id number]  cc: Katha Hamming, MD, <Dictator> Katha Hamming MD ELECTRONICALLY SIGNED 03/21/2012 12:13

## 2014-08-13 NOTE — Consult Note (Signed)
Brief Consult Note: Diagnosis: sob.   Patient was seen by consultant.   Consult note dictated.   Comments: Attempted to see patient- not in room at this time.  Electronic Signatures: Vevelyn PatLondon, Klaus Casteneda H (NP)  (Signed [phone]-Nov-13 15:34)  Authored: Brief Consult Note   Last Updated: 05-Nov-13 15:34 by Keturah BarreLondon, Jvon Meroney H (NP)

## 2014-08-13 NOTE — H&P (Signed)
PATIENT NAME:  Sylvia, Strong MR#:  [id number] DATE OF BIRTH:  [birth year]  DATE OF ADMISSION:  02/27/2012  PRIMARY CARE PHYSICIAN: Jerl Mina, MD  CHIEF COMPLAINT: Shortness of breath for two days.   HISTORY OF PRESENT ILLNESS: Ms. 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 She comes to the emergency room with her guardian/healthcare power of attorney and caregiver from the group home with the above-mentioned chief complaint. The patient reports feeling short of breath for the last two days and feeling fatigued and tired. She was brought to the Emergency Room with shortness of breath, was hypoxic when EMS picked her up, and her heart rate had dropped down into the 30s at that time. She did not require any intervention and her heart rate perked up once she was placed on BiPAP and received Lasix in the emergency room. Currently, during my evaluation, her heart rate is in the upper 50s, which is 57 to 59 and regular. She is currently on noninvasive BiPAP with 100% saturations. She is feeling much better at present. She denies any chest pain or any productive cough. She has an elevated BNP. She is being admitted for acute congestive heart failure, new onset, diastolic. Her Echo is still pending.   PAST MEDICAL HISTORY:  1. 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  2. Chronic anemia.  3. Gastroesophageal reflux disease.  4. Hypertension.  5. Depression.  6. Glaucoma.  7. Type 2 diabetes, insulin-dependent.  8. Morbid obesity.  9. Glaucoma.  10. Diabetic neuropathy. 11. Chronic kidney disease stage II, likely due to diabetes and hypertension.   PAST SURGICAL HISTORY:  1. Cholecystectomy.  2. Right shoulder surgery.  3. Right knee surgery.   ALLERGIES: Aspirin, Cipro, hydrochlorothiazide, Levaquin, Septra, and  salicylates.   MEDICATIONS:  1. Insulin sliding scale.  2. Lantus 26 units at bedtime.  3. Humalog 6 units with each meal.  4. Nitro-Bid 2 inches daily.  5. Metoprolol ER 50 mg p.o. daily.  6. Multivitamin p.o. daily.  7. Dorzolamide hydrochloride one drop to both eyes twice a day. 8. Benicar 40 mg p.o. daily.  9. Amlodipine 10 mg p.o. daily.  10. Nexium 40 mg p.o. daily.  11. Clonidine 0.1 mg p.o. twice a day. 12. Lisinopril 40 mg p.o. twice a day. 13. Senokot S 8.6 mg p.o. twice a day.  14. Surfak 240 mg p.o. at bedtime.  15. Paxil 30 mg p.o. daily.  16. Simvastatin 20 mg p.o. at bedtime.  17. Gabapentin 30 mg p.o. at bedtime.  18. Hydralazine 10 mg p.o. every six hours p.r.n. for elevated blood pressure.   CODE STATUS: FULL CODE.   FAMILY HISTORY: The patient is not able to provide a family history.   REVIEW OF SYSTEMS: CONSTITUTIONAL: No fever. Positive for fatigue and weakness. EYES: No blurred or double vision. Positive for glaucoma. ENT: No tinnitus, ear pain, hearing loss, or snoring. RESPIRATORY: Positive for some shortness of breath. No wheeze or hemoptysis. CARDIOVASCULAR: No chest pain. Positive for dyspnea on exertion and palpitations. No syncope or orthopnea. GASTROINTESTINAL: No nausea, vomiting, diarrhea, abdominal pain, or hematemesis. GU: No dysuria or hematuria. ENDOCRINE: No polyuria or nocturia. HEMATOLOGY: Positive for chronic anemia.    PHYSICAL EXAMINATION:   GENERAL: The patient is awake, alert, and oriented x1.  She is morbidly obese, not in acute distress.  VITALS: She is  afebrile. Pulse is 80 at present. Blood pressure is 122/48 and saturations are 98% on current BiPAP.   HEENT: Atraumatic, normocephalic. Pupils are equal, round, and reactive to light and accommodation. Extraocular movements intact. Oral mucosa is dry.   NECK: Supple. No JVD. No carotid bruit.   RESPIRATORY: Decreased breath sounds in the bases bilaterally. Few crackles heard,  bibasilar. No respiratory distress or labored breathing.   CARDIOVASCULAR: Both the heart sounds are normal. Rate and rhythm is regular. PMI is not lateralized. Chest is nontender.   EXTREMITIES: Trace pedal edema with good pedal pulses and good femoral pulses.   NEUROLOGIC: Again, limited secondary to the patient's capacity to understand given her underlying mental retardation. Grossly nonfocal. The patient moves all extremities well. She does not have any neuro deficits at this time.   PSYCHIATRIC: The patient has significant mental retardation. She does have depression.   LABORATORY, DIAGNOSTIC AND RADIOLOGIC DATA: EKG shows sinus rhythm with PVCs. Nonspecific ST-T changes.   Chest x-ray is consistent with congestive heart failure.   pH 7.19, pCO2 42, and pO2 246. Saturations are 99% on BiPAP setting.   CBC within normal limits. Glucose 180, BUN 41, creatinine 2.88, sodium 139, potassium 4.3, chloride 106, bicarbonate 17, and calcium 9.2. LFTs within normal limits. Troponin 0.63. B-type natriuretic peptide 9093.   ASSESSMENT AND PLAN: 69 year old [name] with history of hypertension, diabetes, and history of chronic mental retardation who comes in with:  1. Acute hypoxic respiratory failure due to acute congestive heart failure, ejection fraction unknown, likely diastolic. The patient became very hypoxic and was placed on BiPAP and felt better after BiPAP and saturations are 98 to 100%. We will wean to nasal cannula oxygen. She received IV Lasix 40 mg in the Emergency Room. The patient will be admitted on the telemetry floor and given 2 grams sodium, ADA 1800 calorie diet. We will give Lasix 20 mg IV twice a day and watch ins and outs, daily weights, and watch creatinine closely. Adjust dosing accordingly. We will start the patient on some Nitro-Bid paste and hydralazine 10 mg four times daily. Check echo of the heart. Cycle cardiac enzymes x3.  2. Elevated troponin which is suspected due to  demand ischemia from acute congestive heart failure. The patient denies any chest pain. EKG does not show any acute ST elevation or depression. She does have some nonspecific lateral T wave changes. We will continue to monitor for now. The patient is intolerant to aspirin. Hence we will continue Nitro-Bid and hydralazine for now. Unable to give beta blockers given bradycardia on arrival. We will also have cardiology consultation.  3. Acute on chronic renal failure, baseline creatinine 1.66. We will give Lasix for congestive heart failure, however, we will watch creatinine very closely. Nephrology consultation in the morning. We will hold on Benicar and lisinopril at this time.  4. Type 2 diabetes, on Lantus and sliding scale along with Humalog 6 units three times daily. I will continue Lantus and sliding scale. Once the patient's p.o. improves, will add Humalog three times daily as well.  5. Mental retardation, chronic. The patient appears to be stable and pleasant at this time. 6. Depression. Continue Paxil.  7. Hypertension. We will continue amlodipine and hydralazine at this time.  8. Transient bradycardia on arrival to the Emergency Room. Heart rate went down in the 30s and came up spontaneously, now in the 50s to 60s. We will hold beta blockers and clonidine for now and resume once the  patient remains stable.  9. Deep vein thrombosis prophylaxis with subcutaneous heparin.           Further work-up according to the patient's clinical course. The hospital admission plan was discussed with the patient, the patient's care guardians and healthcare power of attorney, Ms. Larena SoxGretchen Brodowicz and Ms. Court JoyKatherine Keziah and they are agreeable with the above plan. They want the patient to be FULL CODE for now.   TIME SPENT: 50 minutes.   ____________________________ Wylie HailSona A. Allena KatzPatel, MD sap:slb D: 02/27/2012 21:46:36 ET T: 02/28/2012 07:21:53 ET JOB#: [id number]  cc: Vy Badley A. Allena KatzPatel, MD, <Dictator> Rhona LeavensJames  F. Burnett ShengHedrick, MD Willow OraSONA A Nusayba Cadenas MD ELECTRONICALLY SIGNED 02/28/2012 12:45

## 2014-08-13 NOTE — H&P (Signed)
PATIENT NAME:  Sylvia Strong, Sylvia Strong MR#:  [id number] DATE OF BIRTH:  1945-12-20  DATE OF ADMISSION:  03/08/2012  ADDENDUM:  I spoke with the patient's power-of-attorneys, Ms. Court JoyKatherine Keziah and Larena SoxGretchen Brodowicz,   and they both want to extubate the patient and make her comfortable and just want to try BiPAP. They do not want pressors. Will extubate the patient and move her to 1-C and give her BiPAP. They understand that the patient's condition is critical and her prognosis is poor. They agreed with [facility]. Will start her on morphine and Ativan as needed and keep her on BiPAP. Will see how she does.   TIME SPENT ON DISCUSSIONS: More than an hour.   ____________________________ Katha HammingSnehalatha Blayze Haen, MD sk:drc D: 03/08/2012 19:39:04 ET T: 03/09/2012 07:51:06 ET JOB#: [id number]  cc: Katha HammingSnehalatha Ahnesty Finfrock, MD, <Dictator> Katha HammingSNEHALATHA Hershey Knauer MD ELECTRONICALLY SIGNED 04/11/2012 14:50

## 2014-08-13 NOTE — Consult Note (Signed)
Chief Complaint:   Subjective/Chief Complaint denies abdominal pain nause or vomiting, tolerating regular diet po   VITAL SIGNS/ANCILLARY NOTES: **Vital Signs.:   06-Nov-13 11:27   Vital Signs Type Routine   Temperature Temperature (F) 98.4   Celsius 36.8   Temperature Source oral   Pulse Pulse 94   Respirations Respirations 16   Systolic BP Systolic BP 146   Diastolic BP (mmHg) Diastolic BP (mmHg) 80   Mean BP 102   Pulse Ox % Pulse Ox % 96   Pulse Ox Activity Level  At rest   Oxygen Delivery 3L   Brief Assessment:   Cardiac Regular    Respiratory clear BS    Gastrointestinal details normal Soft  Nontender  Nondistended  No masses palpable  Bowel sounds normal   Lab Results: Hepatic:  06-Nov-13 09:44    Albumin, Serum  2.6  Cardiology:  06-Nov-13 02:41    Ventricular Rate 156   Atrial Rate 136   QRS Duration 98   QT 300   QTc 483   R Axis 45   T Axis 123   ECG interpretation Atrial fibrillation with rapid ventricular response Marked ST abnormality, possible lateral subendocardial injury Abnormal ECG When compared with ECG of 27-Feb-2012 19:27, Atrial fibrillation has replaced Sinus rhythm Vent. rate has increased BY 75 BPM T wave inversion no longer evident in Lateral leads Confirmed by GOLLAN, TIMOTHY ([id number]) on 03/01/2012 1:52:11 PM  Overreader: GOLLAN, TIMOTHY  Routine Chem:  06-Nov-13 09:44    Glucose, Serum  208   BUN  51   Creatinine (comp)  3.18   Sodium, Serum 141   Potassium, Serum 3.9   Chloride, Serum 103   CO2, Serum 25   Calcium (Total), Serum 8.7   Phosphorus, Serum 3.4   Anion Gap 13   Osmolality (calc) 301   eGFR (African American)  17   eGFR (Non-African American)  15 (eGFR values <60mL/min/1.73 m2 may be an indication of chronic kidney disease (CKD). Calculated eGFR is useful in patients with stable renal function. The eGFR calculation will not be reliable in acutely ill patients when serum creatinine is changing rapidly. It is not  useful in  patients on dialysis. The eGFR calculation may not be applicable to patients at the low and high extremes of body sizes, pregnant women, and vegetarians.)  Routine Coag:  06-Nov-13 03:45    Activated PTT (APTT)  93.2 (A HCT value >55% may artifactually increase the APTT. In one study, the increase was an average of 19%. Reference: "Effect on Routine and Special Coagulation Testing Values of Citrate Anticoagulant Adjustment in Patients with High HCT Values." American Journal of Clinical Pathology 2006;126:400-405.)  Routine Hem:  06-Nov-13 03:45    Platelet Count (CBC) 162 (Result(s) reported on 01 Mar 2012 at 04:40AM.)   Hemoglobin (CBC)  9.9 (Result(s) reported on 01 Mar 2012 at 04:40AM.)   Assessment/Plan:  Assessment/Plan:   Assessment 1) nausea vomiting epigastric pain in the setting of new subendocardial MI.  currently resolved.  possibility of gastroparesis, but most likely related to to above.  review of chart indicates 2 previous egd and an ugis without evidence of gastroparesis, no h/o gastric emptying study.    Plan 1) continue ppi daily.  could continue reglan as adjunct for nausea and emperic tx for gastroparesis, with consideration of side effects.   2) cardiology recs noted.  3) consider ugis when clinically feasible, can be done on fu.   Electronic Signatures: Skulskie, Martin (MD)  (  Signed 06-Nov-13 15:46)  Authored: Chief Complaint, VITAL SIGNS/ANCILLARY NOTES, Brief Assessment, Lab Results, Assessment/Plan   Last Updated: 06-Nov-13 15:46 by Skulskie, Martin (MD) 

## 2014-08-13 NOTE — Consult Note (Signed)
PATIENT NAME:  Sylvia Strong, SURGES MR#:  [id number] DATE OF BIRTH:  11-May-1945  DATE OF CONSULTATION:  02/28/2012  REFERRING PHYSICIAN:  Dr. Rudene Re  CONSULTING PHYSICIAN:  Lamar Blinks, MD  PRIMARY CARE PHYSICIAN: Dr. Burnett Sheng   REASON FOR CONSULTATION: Acute congestive heart failure, diabetes, hypertension, hyperlipidemia with abnormal EKG.   CHIEF COMPLAINT: "I got short of breath."   HISTORY OF PRESENT ILLNESS: This is a 69 year old female with known chronic diabetes, hypertension, hyperlipidemia on appropriate medications for this issue including insulin injection, clonidine, Benicar, ACE inhibitor, Norvasc, Toprol-XL, hydralazine. The patient has done relatively well with this medication management with some mild chronic kidney disease. The patient has had had new onset significant shortness of breath, weakness and fatigue with mild amount of chest discomfort on top of that improved with intravenous Lasix. At the time of arrival the patient had normal sinus rhythm with preventricular contractions and nonspecific ST changes. Currently the patient does not have any evidence of acute myocardial infarction. The patient has felt better this morning improving with her breathing.  REVIEW OF SYSTEMS: Remainder of review of systems negative for vision change, ringing in the ears, hearing loss, cough, congestion, heartburn, nausea, vomiting, diarrhea, bloody stools, stomach pain, extremity pain, leg weakness, cramping of the buttocks, known blood clots, headaches, blackouts, dizzy spells, nosebleed, congestion, trouble swallowing, frequent urination, urination at night, muscle weakness, numbness, anxiety, depression, skin lesions, skin rashes.   PAST MEDICAL HISTORY:  1. Diabetes.  2. Hypertension.  3. Hyperlipidemia.   FAMILY HISTORY: No family members with early onset of cardiovascular disease or hypertension.   SOCIAL HISTORY: Currently denies alcohol or tobacco use.   ALLERGIES: She has no  known drug allergies.   CURRENT MEDICATIONS: As listed.   PHYSICAL EXAMINATION:  VITAL SIGNS: Blood pressure 136/68 bilaterally, heart rate 72 upright, reclining, and regular.   GENERAL: She is a well-appearing elderly female in no acute distress.   HEENT: No icterus, thyromegaly, ulcers, hemorrhage, or xanthelasma.   CARDIOVASCULAR: Regular rate and rhythm with normal S1, S2. 2/6 apical murmur consistent with mitral regurgitation. Point of maximal impulse is diffuse. Carotid upstroke normal without bruit. Jugular venous pressure normal.   LUNGS: Lungs have few basilar crackles with normal respirations.   ABDOMEN: Soft, nontender without hepatosplenomegaly or masses. Abdominal aorta is normal size without bruit.   EXTREMITIES: 2+ bilateral pulses in dorsal, pedal, radial, femoral arteries without lower extremity edema, cyanosis, clubbing, ulcers.   NEUROLOGIC: She is oriented to time, place, and person with normal mood and affect.   ASSESSMENT: 69 year old female with diabetes, hypertension, hyperlipidemia, abnormal EKG, shortness of breath, chest pain consistent with acute congestive heart failure.   RECOMMENDATIONS:  1. Intravenous Lasix since changing over to oral Lasix for pulmonary edema, shortness of breath and weight gain. 2. Continue surveillance of serial ECG and enzymes to assess for changes in ECG and/or myocardial infarction and further treatment thereof as necessary.  3. Further ambulation and possible stress test to assess whether patient has had acute heart failure due to ischemic nature.  4. Echocardiogram for LV systolic dysfunction, valvular heart disease and additional medication management.  5. Continue current medical regimen for hypertension control, lower extremity edema and improvements of current symptoms.  6. Insulin injection for diabetes mellitus control with goal hemoglobin A1c below 7 and goal glucose under 117. 7. Aspirin for further risk reduction in  cardiovascular disease and further treatment thereof as necessary.   ____________________________ Lamar Blinks, MD bjk:cms D: 02/28/2012 09:34:36  ET T: 02/28/2012 10:25:45 ET JOB#: [id number]  cc: Lamar BlinksBruce J. Kowalski, MD, <Dictator>  Lamar BlinksBRUCE J KOWALSKI MD ELECTRONICALLY SIGNED 03/02/2012 13:45

## 2014-08-13 NOTE — Consult Note (Signed)
PATIENT NAME:  Sylvia Strong, Sylvia Strong MR#:  Strong DATE OF BIRTH:  11-Nov-1945  DATE OF CONSULTATION:  02/29/2012  REFERRING PHYSICIAN:   Dr. Sherryll BurgerShah CONSULTING PHYSICIAN:  Keturah Barrehristiane H. Anjelo Pullman, NP  PRIMARY CARE PHYSICIAN: Rhona LeavensJames F. Burnett ShengHedrick, MD   REASON FOR CONSULTATION: I appreciate consult for this 69 year old Caucasian woman with a history of MR,  chronic kidney disease,  diabetes mellitus, HL, and obesity who was admitted 11/03 for shortness of breath, congestive heart failure, and found to have NSTEMI. GI has been consulted at the request of Dr. Sherryll BurgerShah to evaluate abdominal pain and nausea, onset today. Currently the patient states she has absolutely no abdominal pain,  but it was epigastric in nature. She states also that her nausea is persistent. She did vomit three times earlier today, some green material. She denies all further GI complaints. Last bowel movement was 02/26/2012. She did have noncontrasted CT of abdomen and pelvis today that showed some pulmonary edema and cardiomegaly. There were no GI findings, although a note was made about a questionable area of inflammation near the umbilicus likely related to a subcutaneous injection. She has been receiving heparin subcutaneous and now is on heparin drip per nomogram. I did note a jump in troponin today up to 10. I noted her echocardiogram findings and Cardiology notes.  The patient also additionally had colonoscopy 06/2011 with one adenoma, one hyperplastic polyp, also underwent EGD in 2007 that was noted to be normal.   PAST MEDICAL HISTORY:  1. Mental retardation since birth.  2. Chronic anemia. 3. Gastroesophageal reflux disease. 4. Hypertension. 5. Depression. 6. Glaucoma.  7. Type 2 diabetes, insulin-dependent.  8. Obesity.  9. Diabetic neuropathy.  10. Chronic kidney disease.   PAST SURGICAL HISTORY:  1. Cholecystectomy.  2. Right shoulder surgery. 3. Right knee surgery.   ALLERGIES: Aspirin, Cipro, hydrochlorothiazide, Levaquin,  Septra and salicylates.   MEDICATIONS:  1. Insulin SSI.  2. Lantus 26 units at bedtime.  3. Humalog 6 units at meal.  4. Nitro-Bid 2 inches daily.  5. Metoprolol ER 50 mg p.o. daily.  6. Multivitamin p.o. daily.  7. Dorzolamide eyedrops b.i.d.  8. Benicar 40 mg p.o. daily.  9. Amlodipine 10 mg p.o. daily.  10. Nexium 40 mg p.o. daily.  11. Clonidine 0.1 mg p.o. b.i.d.  12. Lisinopril 40 mg p.o. b.i.d.  13. Senokot 8.6 mg p.o. b.i.d.  14. Surfak 240 mg p.o. at bedtime.  15. Paxil 30 mg p.o. daily.  16. Simvastatin 20 mg p.o. at bedtime.  17. Gabapentin 300 mg p.o. at bedtime.  18. Hydralazine 10 mg p.o. every 6 hours p.r.n. elevated blood pressure.   FAMILY HISTORY: Chart review is positive for diabetes, hypertension.   SOCIAL HISTORY: She now lives in assisted living. No alcohol, illicits or tobacco.   REVIEW OF SYSTEMS: CONSTITUTIONAL: Has had low-grade temps since hospital admission, T-max 100.4. Most recent was 99.1. Some fatigue and weakness. No weight loss. HEENT: No complaint of visual changes. Does have history of glaucoma. No tinnitus, ear pain, hearing loss, snoring. RESPIRATORY: Was admitted with shortness of breath. No history of wheeze or hemoptysis. No complaints of shortness of breath at present. She is wearing O2 at 3 liters. CARDIOVASCULAR: Per note review, has had some chest pain on and off since admission, mild in nature, none at present. Has had palpitations, has had some dyspnea on exertion. GI: As noted above. GENITOURINARY: No complaints. ENDOCRINE: Does have history of diabetes. No history of thyroid disorder, although her TSH  was somewhat elevated on her lab work. HEMATOLOGIC: Has a history of chronic anemia. NEUROLOGICAL: No history of cerebrovascular accident. No complaints of headaches or other issue at present.  LABORATORY, DIAGNOSTIC AND RADIOLOGICAL DATA: Most recent labs: Glucose 300, BNP 9993, BUN 46, creatinine 2.95, sodium 139, potassium 4.3, GFR 16,  calcium 8.7, phosphorus 4.4. Hemoglobin A1c 6.4, albumin 2.8, total serum protein 6.7, bilirubin 0.3, alkaline phosphatase 77, AST 28, ALT 19. Both CPK-MB and troponins have been elevated since presentation to the hospital. Troponin level today was 10.13. WBC 8, hemoglobin 9.6, hematocrit 28.6, platelets 160. Red cells are normocytic with no increase in her RDW. EKG on admission with concerns for lateral ischemia. Echo done with an LVEF of 50%, moderately dilated left atrium, mildly dilated right atrium, moderate MR, mild TR. Aortic and pulmonic valves were normal. Pertinent GI imaging results as noted above.   PHYSICAL EXAMINATION:  VITAL SIGNS: Most recent vital signs: Temperature 99.1, pulse 90, respiratory rate 18, blood pressure 141/77, SaO2 96% on room air.   GENERAL: A well-appearing obese woman lying in bed in no acute distress.   HEENT: Normocephalic, atraumatic. No redness, drainage, or erythema to the eyes or the nares. Oral mucous membranes are pink and moist.   NECK: Supple. No JVD, thyromegaly, adenopathy.   RESPIRATORY: Faint bibasilar crackles, otherwise clear to auscultation bilaterally. No increased work of breathing. Respirations are eupneic. Able to speak in complete sentences.  CARDIOVASCULAR: Grade 2/6 systolic murmur heard throughout auscultation points. No gallop. Regular rate and rhythm. Peripheral pulses 2+. No appreciable edema.   ABDOMEN: Obese, bowel sounds x4, nondistended, nontender. No guarding, rigidity, peritoneal signs, hepatosplenomegaly, organomegaly or other abnormalities noted. Last bowel movement was 02/26/2012.   RECTAL: Deferred.   GENITOURINARY: Deferred.   EXTREMITIES: Moves all extremities well x4. Strength 5 out of 5. No clubbing or cyanosis.   SKIN: Warm, dry, somewhat pale but pink. No erythema, lesion or rash.   NEUROLOGIC: Alert, oriented x3. Cranial nerves II through XII intact. Speech clear. No facial droop.   PSYCHIATRIC: Pleasant,  conversant, somewhat slow but appropriate as consistent with her baseline of MR, cooperative.   IMPRESSION AND PLAN: Abdominal pain, nausea, abdominal pain resolved, nausea, somewhat persistent, vomiting resolved. These may be symptoms of her NSTEMI. We will assess stool for H. pylori, change PPI to b.i.d., IV pantoprazole, schedule some antiemetics for better control, and start MiraLax daily p.r.n. since it has been 3 days since her last bowel movement. She is not a candidate for endoscopic procedures at this time due to her cardiac events and her heparinization. We will follow with you.   Thank you for this consult.   Services were provided by Vevelyn Pat, MSN, NPC in collaboration with Christena Deem, M.D. with whom I have discussed this patient in full.   ____________________________ Keturah Barre, NP chl:cbb D: 02/29/2012 16:30:55 ET T: 02/29/2012 16:45:47 ET JOB#: [id number]  cc: Keturah Barre, NP, <Dictator> Eustaquio Maize Maceo Hernan FNP ELECTRONICALLY SIGNED 03/01/2012 8:21

## 2014-08-13 NOTE — Consult Note (Signed)
Chief Complaint:   Subjective/Chief Complaint tolerating regular diet, denies abdominal pain or nausea.   VITAL SIGNS/ANCILLARY NOTES: **Vital Signs.:   07-Nov-13 13:00   Vital Signs Type Routine   Temperature Temperature (F) 97.6   Celsius 36.4   Temperature Source oral   Pulse Pulse 67   Respirations Respirations 18   Systolic BP Systolic BP 003   Diastolic BP (mmHg) Diastolic BP (mmHg) 66   Mean BP 93   Pulse Ox % Pulse Ox % 96   Pulse Ox Activity Level  At rest   Oxygen Delivery 1L; Nasal Cannula   Brief Assessment:   Cardiac Regular    Respiratory clear BS    Gastrointestinal details normal Soft  Nontender  Nondistended  No masses palpable  Bowel sounds normal   Lab Results: Hepatic:  07-Nov-13 04:00    Albumin, Serum  2.5  Routine Chem:  07-Nov-13 04:00    Result Comment CBC - SPECIMEN INTEGRITY ISSUE. CBC HAS BEEN2  - RECOLLECTED AND REORDERED..TPL  Result(s) reported on 02 Mar 2012 at 07:56AM.   Glucose, Serum  163   BUN  55   Creatinine (comp)  3.49   Sodium, Serum 140   Potassium, Serum 3.8   Chloride, Serum 103   CO2, Serum 27   Calcium (Total), Serum 8.7   Phosphorus, Serum 3.9   Anion Gap 10   Osmolality (calc) 298   eGFR (African American)  15   eGFR (Non-African American)  13 (eGFR values <91mL/min/1.73 m2 may be an indication of chronic kidney disease (CKD). Calculated eGFR is useful in patients with stable renal function. The eGFR calculation will not be reliable in acutely ill patients when serum creatinine is changing rapidly. It is not useful in  patients on dialysis. The eGFR calculation may not be applicable to patients at the low and high extremes of body sizes, pregnant women, and vegetarians.)  Routine Coag:  07-Nov-13 04:00    Activated PTT (APTT)  37.5 (A HCT value >55% may artifactually increase the APTT. In one study, the increase was an average of 19%. Reference: "Effect on Routine and Special Coagulation Testing Values of  Citrate Anticoagulant Adjustment in Patients with High HCT Values." American Journal of Clinical Pathology 2006;126:400-405.)  Routine Hem:  07-Nov-13 04:00    WBC (CBC) -   RBC (CBC) -   Hemoglobin (CBC) -   Hematocrit (CBC) -   Platelet Count (CBC) -   MCV -   MCH -   MCHC -   RDW -   Neutrophil % -   Lymphocyte % -   Monocyte % -   Eosinophil % -   Basophil % -   Neutrophil # -   Lymphocyte # -   Monocyte # -   Eosinophil # -   Basophil # -   Bands -   Segmented Neutrophils -   Lymphocytes -   Variant Lymphocytes -   Monocytes -   Eosinophil -   Basophil -   Metamyelocyte -   Myelocyte -   Promyelocyte -   Blast-Like -   Other Cells -   NRBC -   Diff Comment 1 -   Diff Comment 2 -   Diff Comment 3 -   Diff Comment 4 -   Diff Comment 5 -   Diff Comment 6 -   Diff Comment 7 -   Diff Comment 8 -   Diff Comment 9 -   Diff Comment 10 - (Result(s)  reported on 02 Mar 2012 at 07:56AM.)    07:01    WBC (CBC) 6.2   RBC (CBC)  3.19   Hemoglobin (CBC)  10.0   Hematocrit (CBC)  29.7   Platelet Count (CBC) 174   MCV 93   MCH 31.5   MCHC 33.7   RDW 13.2   Neutrophil % 76.0   Lymphocyte % 14.2   Monocyte % 8.4   Eosinophil % 1.1   Basophil % 0.3   Neutrophil # 4.7   Lymphocyte #  0.9   Monocyte # 0.5   Eosinophil # 0.1   Basophil # 0.0 (Result(s) reported on 02 Mar 2012 at 08:06AM.)   Assessment/Plan:  Assessment/Plan:   Assessment 1) n/v epigastric pain-resolved, possible related to NSTEMI versus gastritis/gastroparesis related to DM.    Plan 1) no plans for endoscopy at present.  fu with GI in 2-3 weeks, continue current meds-ppi bid and reglan. will sign off, reconsult as needed.   Electronic Signatures: Loistine Simas (MD)  (Signed [phone] 13:49)  Authored: Chief Complaint, VITAL SIGNS/ANCILLARY NOTES, Brief Assessment, Lab Results, Assessment/Plan   Last Updated: 07-Nov-13 13:49 by Loistine Simas (MD)

## 2014-08-13 NOTE — Consult Note (Signed)
Chief Complaint:   Subjective/Chief Complaint Patietn seen and examined, chart reviewed.  Please see full GI consult for recommendations.   VITAL SIGNS/ANCILLARY NOTES: **Vital Signs.:   05-Nov-13 19:54   Vital Signs Type Routine   Temperature Temperature (F) 98.6   Celsius 37   Temperature Source Oral   Pulse Pulse 98   Respirations Respirations 18   Systolic BP Systolic BP 153   Diastolic BP (mmHg) Diastolic BP (mmHg) 75   Mean BP 101   Pulse Ox % Pulse Ox % 96   Pulse Ox Activity Level  At rest   Oxygen Delivery 3L   Electronic Signatures: Barnetta ChapelSkulskie, Martin (MD)  (Signed [phone]-Nov-13 20:23)  Authored: Chief Complaint, VITAL SIGNS/ANCILLARY NOTES   Last Updated: 05-Nov-13 20:23 by Barnetta ChapelSkulskie, Martin (MD)
